# Patient Record
Sex: Female | Born: 1995 | Race: Black or African American | Hispanic: No | Marital: Single | Smoking: Never smoker
Health system: Southern US, Community
[De-identification: ages and names within clinical notes are randomized; demographics above are authoritative.]

## PROBLEM LIST (undated history)

## (undated) ENCOUNTER — Inpatient Hospital Stay: Payer: Self-pay

## (undated) DIAGNOSIS — J45909 Unspecified asthma, uncomplicated: Secondary | ICD-10-CM

---
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
## 2016-01-05 ENCOUNTER — Ambulatory Visit
Admission: RE | Admit: 2016-01-05 | Discharge: 2016-01-05 | Disposition: A | Discharge status: Home / Self Care | Payer: Medicaid Other | Source: Ambulatory Visit | Attending: Physician Assistant | Admitting: Physician Assistant

## 2016-01-05 DIAGNOSIS — Z3492 Encounter for supervision of normal pregnancy, unspecified, second trimester: Secondary | ICD-10-CM | POA: Insufficient documentation

## 2016-02-26 ENCOUNTER — Emergency Department
Admission: EM | Admit: 2016-02-26 | Discharge: 2016-02-26 | Disposition: A | Discharge status: Home / Self Care | Payer: Medicaid Other | Attending: Emergency Medicine | Admitting: Emergency Medicine

## 2016-02-26 ENCOUNTER — Emergency Department: Payer: Medicaid Other

## 2016-02-26 ENCOUNTER — Encounter: Payer: Self-pay | Admitting: Emergency Medicine

## 2016-02-26 DIAGNOSIS — O9A22 Injury, poisoning and certain other consequences of external causes complicating childbirth: Secondary | ICD-10-CM | POA: Diagnosis not present

## 2016-02-26 DIAGNOSIS — O9A212 Injury, poisoning and certain other consequences of external causes complicating pregnancy, second trimester: Secondary | ICD-10-CM | POA: Diagnosis present

## 2016-02-26 DIAGNOSIS — Z3A24 24 weeks gestation of pregnancy: Secondary | ICD-10-CM | POA: Insufficient documentation

## 2016-02-26 DIAGNOSIS — Y939 Activity, unspecified: Secondary | ICD-10-CM | POA: Diagnosis not present

## 2016-02-26 DIAGNOSIS — Y999 Unspecified external cause status: Secondary | ICD-10-CM | POA: Diagnosis not present

## 2016-02-26 DIAGNOSIS — Y92219 Unspecified school as the place of occurrence of the external cause: Secondary | ICD-10-CM | POA: Insufficient documentation

## 2016-02-26 DIAGNOSIS — X58XXXA Exposure to other specified factors, initial encounter: Secondary | ICD-10-CM | POA: Diagnosis not present

## 2016-02-26 DIAGNOSIS — J45909 Unspecified asthma, uncomplicated: Secondary | ICD-10-CM | POA: Diagnosis not present

## 2016-02-26 DIAGNOSIS — S86811A Strain of other muscle(s) and tendon(s) at lower leg level, right leg, initial encounter: Secondary | ICD-10-CM | POA: Insufficient documentation

## 2016-02-26 HISTORY — DX: Unspecified asthma, uncomplicated: J45.909

## 2016-02-26 NOTE — ED Notes (Signed)
Pt states right calf pain and swelling that began this am, denies injury to area. No visual swelling noted. Unable to assess warmth to area.

## 2016-02-26 NOTE — ED Provider Notes (Signed)
[facility] Emergency Department Provider Note        Time seen: ----------------------------------------- 4:56 PM on 02/26/2016 -----------------------------------------    I have reviewed the triage vital signs and the nursing notes.   HISTORY  Chief Complaint Leg Pain    HPI Catherine Schneider is a 20 y.o. female who presents to ER for right calf pain and swelling that began this morning. She denies any injuries, states she does go below stairs while she is in school. She is currently 6 months pregnant, denies vaginal bleeding or pregnancy related complaints. She states she still feels the baby move.   Past Medical History  Diagnosis Date  . Asthma     There are no active problems to display for this patient.   History reviewed. No pertinent past surgical history.  Allergies Review of patient's allergies indicates no known allergies.  Social History Social History  Substance Use Topics  . Smoking status: Never Smoker   . Smokeless tobacco: None  . Alcohol Use: No    Review of Systems Constitutional: Negative for fever. Cardiovascular: Negative for chest pain. Respiratory: Negative for shortness of breath. Gastrointestinal: Negative for abdominal pain, vomiting and diarrhea. Genitourinary: Negative for dysuria.Denies vaginal bleeding Musculoskeletal:Positive for right calf pain Skin: Negative for rash.  ____________________________________________   PHYSICAL EXAM:  VITAL SIGNS: ED Triage Vitals  Enc Vitals Group     BP 02/26/16 1340 94/60 mmHg     Pulse Rate 02/26/16 1340 99     Resp 02/26/16 1340 18     Temp 02/26/16 1340 98.1 F (36.7 C)     Temp Source 02/26/16 1340 Oral     SpO2 02/26/16 1340 100 %     Weight 02/26/16 1342 112 lb 14.4 oz (51.211 kg)     Height 02/26/16 1341  (1.651 m)     Head Cir --      Peak Flow --      Pain Score 02/26/16 1341 10     Pain Loc --      Pain Edu? --      Excl. in GC? --      Constitutional: Alert and oriented. Well appearing and in no distress. Eyes: Conjunctivae are normal. PERRL. Normal extraocular movements. Cardiovascular: Normal rate, regular rhythm. No murmurs, rubs, or gallops. Respiratory: Normal respiratory effort without tachypnea nor retractions. Breath sounds are clear and equal bilaterally. No wheezes/rales/rhonchi. Gastrointestinal: Gravid uterus, nontender Musculoskeletal: Minimal right calf tenderness and pain with range of motion. Neurologic:  Normal speech and language. No gross focal neurologic deficits are appreciated.  Skin:  Skin is warm, dry and intact. No rash noted. Psychiatric: Mood and affect are normal. Speech and behavior are normal.  ____________________________________________  ED COURSE:  Pertinent labs & imaging results that were available during my care of the patient were reviewed by me and considered in my medical decision making (see chart for details). Patient's no acute distress, will obtain ultrasound reevaluate. ____________________________________________   RADIOLOGY  IMPRESSION: No evidence of DVT within right lower extremity.  ____________________________________________  FINAL ASSESSMENT AND PLAN  Right calf strain  Plan: Patient with imaging as dictated above. Patient received an ultrasound to assess for DVT as she is pregnant. Fetal heart tones here were within normal limits. She is stable for discharge. Advised Tylenol as needed for pain.   Emily Filbert, MD   Note: This dictation was prepared with Dragon dictation. Any transcriptional errors that result from this process are unintentional  Emily FilbertJonathan E Williams, MD 02/26/16 [phone]

## 2016-02-26 NOTE — ED Notes (Signed)
Pt is 6 months pregnant, prenatal care at [facility].

## 2016-02-26 NOTE — Discharge Instructions (Signed)

## 2016-02-26 NOTE — ED Notes (Signed)
This RN assessed fetal heart tones, HR 150

## 2016-04-26 LAB — OB RESULTS CONSOLE GC/CHLAMYDIA
CHLAMYDIA, DNA PROBE: NEGATIVE
Gonorrhea: NEGATIVE

## 2016-04-26 LAB — OB RESULTS CONSOLE GBS: STREP GROUP B AG: POSITIVE

## 2016-05-10 LAB — OB RESULTS CONSOLE GBS: STREP GROUP B AG: POSITIVE

## 2016-05-17 ENCOUNTER — Observation Stay
Admission: EM | Admit: 2016-05-17 | Discharge: 2016-05-17 | Disposition: A | Discharge status: Home / Self Care | Payer: Medicaid Other | Source: Home / Self Care | Admitting: Obstetrics and Gynecology

## 2016-05-17 DIAGNOSIS — O9952 Diseases of the respiratory system complicating childbirth: Secondary | ICD-10-CM | POA: Diagnosis present

## 2016-05-17 DIAGNOSIS — O99824 Streptococcus B carrier state complicating childbirth: Secondary | ICD-10-CM | POA: Diagnosis present

## 2016-05-17 DIAGNOSIS — O471 False labor at or after 37 completed weeks of gestation: Secondary | ICD-10-CM

## 2016-05-17 DIAGNOSIS — Z3A39 39 weeks gestation of pregnancy: Secondary | ICD-10-CM | POA: Insufficient documentation

## 2016-05-17 DIAGNOSIS — J45909 Unspecified asthma, uncomplicated: Secondary | ICD-10-CM | POA: Diagnosis present

## 2016-05-17 MED ORDER — HYDROCODONE-ACETAMINOPHEN 5-325 MG PO TABS
2.0000 | ORAL_TABLET | Freq: Once | ORAL | Status: AC
  Administered 2016-05-17: 2 via ORAL
  Filled 2016-05-17: qty 2

## 2016-05-17 NOTE — OB Triage Note (Signed)
Pt. reports contractions beginning this afternoon and intensifying since then. Denies LOF (except discharge), VB; endorses good fetal movement.

## 2016-05-17 NOTE — OB Triage Note (Signed)
Pt presents to l/d with c/o contraction pains that started yesterday.

## 2016-05-18 ENCOUNTER — Inpatient Hospital Stay
Admission: EM | Admit: 2016-05-18 | Discharge: 2016-05-20 | DRG: 775 | Disposition: A | Discharge status: Home / Self Care | Payer: Medicaid Other | Attending: Obstetrics and Gynecology | Admitting: Obstetrics and Gynecology

## 2016-05-18 DIAGNOSIS — IMO0001 Reserved for inherently not codable concepts without codable children: Secondary | ICD-10-CM

## 2016-05-18 DIAGNOSIS — O9952 Diseases of the respiratory system complicating childbirth: Secondary | ICD-10-CM | POA: Diagnosis present

## 2016-05-18 DIAGNOSIS — O99824 Streptococcus B carrier state complicating childbirth: Secondary | ICD-10-CM | POA: Diagnosis present

## 2016-05-18 DIAGNOSIS — Z3A39 39 weeks gestation of pregnancy: Secondary | ICD-10-CM | POA: Diagnosis not present

## 2016-05-18 DIAGNOSIS — J45909 Unspecified asthma, uncomplicated: Secondary | ICD-10-CM | POA: Diagnosis present

## 2016-05-18 LAB — RAPID HIV SCREEN (HIV 1/2 AB+AG)
HIV 1/2 Antibodies: NONREACTIVE
HIV-1 P24 Antigen - HIV24: NONREACTIVE

## 2016-05-18 LAB — CBC
HEMATOCRIT: 35.2 % (ref 35.0–47.0)
HEMOGLOBIN: 11.5 g/dL — AB (ref 12.0–16.0)
MCH: 22.3 pg — AB (ref 26.0–34.0)
MCHC: 32.5 g/dL (ref 32.0–36.0)
MCV: 68.4 fL — AB (ref 80.0–100.0)
Platelets: 251 10*3/uL (ref 150–440)
RBC: 5.15 MIL/uL (ref 3.80–5.20)
RDW: 15.2 % — ABNORMAL HIGH (ref 11.5–14.5)
WBC: 20.6 10*3/uL — ABNORMAL HIGH (ref 3.6–11.0)

## 2016-05-18 LAB — TYPE AND SCREEN
ABO/RH(D): O POS
Antibody Screen: NEGATIVE

## 2016-05-18 LAB — CHLAMYDIA/NGC RT PCR (ARMC ONLY)
Chlamydia Tr: NOT DETECTED
N GONORRHOEAE: NOT DETECTED

## 2016-05-18 MED ORDER — SENNOSIDES-DOCUSATE SODIUM 8.6-50 MG PO TABS: 2.0000 | ORAL_TABLET | ORAL | Status: DC

## 2016-05-18 MED ORDER — OXYCODONE-ACETAMINOPHEN 5-325 MG PO TABS: 1.0000 | ORAL_TABLET | ORAL | Status: DC | PRN

## 2016-05-18 MED ORDER — SODIUM CHLORIDE 0.9 % IV SOLN
INTRAVENOUS | Status: AC
  Administered 2016-05-18: 2 g via INTRAVENOUS
  Filled 2016-05-18: qty 2000

## 2016-05-18 MED ORDER — AMPICILLIN SODIUM 1 G IJ SOLR
1.0000 g | INTRAMUSCULAR | Status: DC
  Administered 2016-05-18: 1 g via INTRAVENOUS
  Filled 2016-05-18 (×6): qty 1000

## 2016-05-18 MED ORDER — OXYCODONE-ACETAMINOPHEN 5-325 MG PO TABS: 2.0000 | ORAL_TABLET | ORAL | Status: DC | PRN

## 2016-05-18 MED ORDER — FERROUS SULFATE 325 (65 FE) MG PO TABS
325.0000 mg | ORAL_TABLET | Freq: Two times a day (BID) | ORAL | Status: DC
  Administered 2016-05-18 – 2016-05-20 (×4): 325 mg via ORAL
  Filled 2016-05-18 (×4): qty 1

## 2016-05-18 MED ORDER — SIMETHICONE 80 MG PO CHEW: 80.0000 mg | CHEWABLE_TABLET | ORAL | Status: DC | PRN

## 2016-05-18 MED ORDER — PRENATAL MULTIVITAMIN CH
1.0000 | ORAL_TABLET | Freq: Every day | ORAL | Status: DC
  Administered 2016-05-19 – 2016-05-20 (×2): 1 via ORAL
  Filled 2016-05-18 (×2): qty 1

## 2016-05-18 MED ORDER — DIPHENHYDRAMINE HCL 25 MG PO CAPS: 25.0000 mg | ORAL_CAPSULE | Freq: Four times a day (QID) | ORAL | Status: DC | PRN

## 2016-05-18 MED ORDER — BENZOCAINE-MENTHOL 20-0.5 % EX AERO: 1.0000 "application " | INHALATION_SPRAY | CUTANEOUS | Status: DC | PRN

## 2016-05-18 MED ORDER — SOD CITRATE-CITRIC ACID 500-334 MG/5ML PO SOLN: 30.0000 mL | ORAL | Status: DC | PRN

## 2016-05-18 MED ORDER — FENTANYL CITRATE (PF) 100 MCG/2ML IJ SOLN
INTRAMUSCULAR | Status: AC
  Administered 2016-05-18: 25 ug via INTRAVENOUS
  Filled 2016-05-18: qty 2

## 2016-05-18 MED ORDER — COCONUT OIL OIL: 1.0000 "application " | TOPICAL_OIL | Status: DC | PRN

## 2016-05-18 MED ORDER — LACTATED RINGERS IV SOLN: INTRAVENOUS | Status: DC

## 2016-05-18 MED ORDER — IBUPROFEN 600 MG PO TABS
600.0000 mg | ORAL_TABLET | Freq: Four times a day (QID) | ORAL | Status: DC
  Administered 2016-05-18 – 2016-05-20 (×5): 600 mg via ORAL
  Filled 2016-05-18 (×5): qty 1

## 2016-05-18 MED ORDER — SODIUM CHLORIDE 0.9 % IV SOLN
2.0000 g | Freq: Once | INTRAVENOUS | Status: AC
  Administered 2016-05-18: 2 g via INTRAVENOUS

## 2016-05-18 MED ORDER — LIDOCAINE HCL (PF) 1 % IJ SOLN
30.0000 mL | INTRAMUSCULAR | Status: DC | PRN
  Administered 2016-05-18: 30 mL via SUBCUTANEOUS

## 2016-05-18 MED ORDER — OXYTOCIN 40 UNITS IN LACTATED RINGERS INFUSION - SIMPLE MED
2.5000 [IU]/h | INTRAVENOUS | Status: DC
  Administered 2016-05-18: 2.5 [IU]/h via INTRAVENOUS

## 2016-05-18 MED ORDER — DIBUCAINE 1 % RE OINT: 1.0000 "application " | TOPICAL_OINTMENT | RECTAL | Status: DC | PRN

## 2016-05-18 MED ORDER — AMMONIA AROMATIC IN INHA
RESPIRATORY_TRACT | Status: AC
  Filled 2016-05-18: qty 10

## 2016-05-18 MED ORDER — WITCH HAZEL-GLYCERIN EX PADS: 1.0000 "application " | MEDICATED_PAD | CUTANEOUS | Status: DC | PRN

## 2016-05-18 MED ORDER — MISOPROSTOL 200 MCG PO TABS
ORAL_TABLET | ORAL | Status: AC
  Filled 2016-05-18: qty 4

## 2016-05-18 MED ORDER — LACTATED RINGERS IV SOLN
INTRAVENOUS | Status: DC
  Administered 2016-05-18 (×2): via INTRAVENOUS

## 2016-05-18 MED ORDER — ACETAMINOPHEN 325 MG PO TABS: 650.0000 mg | ORAL_TABLET | ORAL | Status: DC | PRN

## 2016-05-18 MED ORDER — BUTORPHANOL TARTRATE 1 MG/ML IJ SOLN
1.0000 mg | INTRAMUSCULAR | Status: DC | PRN
  Administered 2016-05-18: 1 mg via INTRAVENOUS
  Filled 2016-05-18: qty 1

## 2016-05-18 MED ORDER — LACTATED RINGERS IV SOLN: 500.0000 mL | INTRAVENOUS | Status: DC | PRN

## 2016-05-18 MED ORDER — OXYTOCIN 10 UNIT/ML IJ SOLN
INTRAMUSCULAR | Status: AC
  Filled 2016-05-18: qty 2

## 2016-05-18 MED ORDER — OXYTOCIN BOLUS FROM INFUSION
500.0000 mL | Freq: Once | INTRAVENOUS | Status: AC
  Administered 2016-05-18: 500 mL via INTRAVENOUS

## 2016-05-18 MED ORDER — ONDANSETRON HCL 4 MG PO TABS: 4.0000 mg | ORAL_TABLET | ORAL | Status: DC | PRN

## 2016-05-18 MED ORDER — FENTANYL CITRATE (PF) 100 MCG/2ML IJ SOLN
25.0000 ug | Freq: Once | INTRAMUSCULAR | Status: AC
  Administered 2016-05-18: 25 ug via INTRAVENOUS

## 2016-05-18 MED ORDER — ZOLPIDEM TARTRATE 5 MG PO TABS: 5.0000 mg | ORAL_TABLET | Freq: Every evening | ORAL | Status: DC | PRN

## 2016-05-18 MED ORDER — ONDANSETRON HCL 4 MG/2ML IJ SOLN: 4.0000 mg | INTRAMUSCULAR | Status: DC | PRN

## 2016-05-18 MED ORDER — LIDOCAINE HCL (PF) 1 % IJ SOLN
INTRAMUSCULAR | Status: AC
  Administered 2016-05-18: 30 mL via SUBCUTANEOUS
  Filled 2016-05-18: qty 30

## 2016-05-18 MED ORDER — OXYTOCIN 40 UNITS IN LACTATED RINGERS INFUSION - SIMPLE MED
INTRAVENOUS | Status: AC
  Administered 2016-05-18: 500 mL via INTRAVENOUS
  Filled 2016-05-18: qty 1000

## 2016-05-18 MED ORDER — ONDANSETRON HCL 4 MG/2ML IJ SOLN: 4.0000 mg | Freq: Four times a day (QID) | INTRAMUSCULAR | Status: DC | PRN

## 2016-05-18 NOTE — OB Triage Note (Signed)
Pt. presented to [facility] for a labor evaluation the evening of 7.25; she was discharged and given labor precautions. She reports new onset vaginal bleeding (" a lot, not spots") and continued contractions. Endorses good fetal movement.

## 2016-05-18 NOTE — H&P (Signed)
Anitha A Coriz is a 20 y.o. female presenting for active labor at 39+2 weeks . Cx 5 cm and BBOW .. + GBS OB History    Gravida Para Term Preterm AB Living   1             SAB TAB Ectopic Multiple Live Births                 Past Medical History:  Diagnosis Date  . Asthma   likely alpha thalessemia  History reviewed. No pertinent surgical history. Family History: family history is not on file. Social History:  reports that she has never smoked. She has never used smokeless tobacco. She reports that she does not drink alcohol or use drugs.     ROS    No labs from [facility]  History Dilation: 6 Effacement (%): 90 Station: 0 Exam by:: ess, RN Blood pressure 119/77, pulse 83, temperature 98.4 F (36.9 C), temperature source Oral, resp. rate 16, height 5\' 3"  (1.6 m), weight 59 kg (130 lb).  EFM : + reassuring, ctx q 4 min .  Exam Physical Exam  Lungs CTA  CV RRR cx exam as above   Prenatal labs: ABO, Rh:   Antibody:   Rubella:   RPR:    HBsAg:    HIV:    GBS: Positive (07/18 0000)  No other labs in chart  Assessment/Plan: Labor , + GBS  Start ampicillin  Reassuring fetal monitoring  Will call again for lab results    SCHERMERHORN,THOMAS 05/18/2016, 7:15 AM

## 2016-05-18 NOTE — Progress Notes (Signed)
I am assuming care of this patient with Dr. Dalbert Garnet as my backup physician   S:   Pt. Has recently received 1mg  Stadol IVP and is sleeping          O:  VS: Blood pressure 111/68, pulse 81, temperature 97.6 F (36.4 C), temperature source Oral, resp. rate 18, height 5\' 3"  (1.6 m), weight 59 kg (130 lb).        FHR : baseline 135 bpm / variability minimal to moderate - just received Stadol  / accelerations + / occasional late decelerations        Toco: contractions every 5-6 minutes / Moderate        Cervix : Dilation: 7 Effacement (%): 80 Station: 0 Presentation: Vertex Exam by:: JRT JAC  Positive scalp stimulation         Membranes: intact  A:  Active labor     FHR category 2     GBS Positive   P: Will plan for AROM after adequate time after abx      IVF Bolus 500mg  x1       Apply oxygen by facemask       Anticipate NSVD  Carlean Jews, CNM

## 2016-05-18 NOTE — Progress Notes (Signed)
S:  Pt. Feeling more pressure and involuntarily pushing       Sleeping/lethargic in between ctxs  O:  VS: Blood pressure 108/67, pulse 80, temperature 97.6 F (36.4 C), temperature source Oral, resp. rate 18, height 5\' 3"  (1.6 m), weight 59 kg (130 lb).        FHR : baseline 135bpm / variability minimal to moderate / accelerations + /  decelerations / positive scalp stimulation         Toco: contractions every 3-5 minutes / Moderate-strong        Cervix : Dilation: 9 Effacement (%): 100 Station: 0 Presentation: Vertex Exam by:: Sharyl Nimrod, CNM        Membranes: SROM - clear/white fluid   A: Active labor     FHR category 2     GBS positive   P: Caput at +1 with narrow pubic arch       Has received 1 dose of Ampicillin at 0718      Anticipate NSVD  Carlean Jews, CNM

## 2016-05-19 LAB — CBC
HCT: 30 % — ABNORMAL LOW (ref 35.0–47.0)
Hemoglobin: 9.7 g/dL — ABNORMAL LOW (ref 12.0–16.0)
MCH: 22.2 pg — ABNORMAL LOW (ref 26.0–34.0)
MCHC: 32.3 g/dL (ref 32.0–36.0)
MCV: 68.8 fL — AB (ref 80.0–100.0)
PLATELETS: 236 10*3/uL (ref 150–440)
RBC: 4.36 MIL/uL (ref 3.80–5.20)
RDW: 15.2 % — AB (ref 11.5–14.5)
WBC: 22.5 10*3/uL — AB (ref 3.6–11.0)

## 2016-05-19 LAB — RPR: RPR: NONREACTIVE

## 2016-05-19 NOTE — Progress Notes (Signed)
Post Partum Day 1 Subjective: Feeling well, no complaints  Objective: Blood pressure 115/64, pulse 94, temperature 98 F (36.7 C), temperature source Oral, resp. rate 18, height 5\' 3"  (1.6 m), weight 130 lb (59 kg), SpO2 97 %.  Physical Exam:  General: A,A, & O x 3 Lochia: mod, no clots Uterine Fundus:Firm, U-2 Perineum intact DVT Evaluation:Neg Homans   Recent Labs  05/18/16 0646 05/19/16 0522  HGB 11.5* 9.7*  HCT 35.2 30.0*  WBC 20.6* 22.5*  PLT 251 236    Assessment/Plan: A: PPD#1 stable 2. 3rd degree lac P: DC in am   LOS: 1 day   Sharee Pimple 05/19/2016, 9:09 AM

## 2016-05-19 NOTE — Clinical Social Work Note (Signed)
CSW received consult from night nurse stating patient is cognitively impaired. CSW met with patient's day nurse and she stated that patient has not behaved as though she has cognitive issues today and that she has cared for her newborn appropriately. CSW went in to speak with patient and patient was sleeping soundly and did not awaken. Patient's newborn was in the nursery. CSW will attempt to see patient again. Shela Leff MSW,LCSW [phone]

## 2016-05-20 MED ORDER — IBUPROFEN 600 MG PO TABS: 600.0000 mg | ORAL_TABLET | Freq: Four times a day (QID) | ORAL | 0 refills | Status: DC

## 2016-05-20 NOTE — Discharge Summary (Signed)
Obstetric Discharge Summary Reason for Admission: onset of labor Prenatal Procedures: none Intrapartum Procedures: spontaneous vaginal delivery and 3rd deg (stage 1) laceration Postpartum Procedures: none Complications-Operative and Postpartum: 3 degree perineal laceration Hemoglobin  Date Value Ref Range Status  05/19/2016 9.7 (L) 12.0 - 16.0 g/dL Final   HCT  Date Value Ref Range Status  05/19/2016 30.0 (L) 35.0 - 47.0 % Final    Physical Exam:  General: alert, cooperative and appears stated age 20: appropriate Uterine Fundus: firm Incision: healing well, no significant drainage, no dehiscence DVT Evaluation: No evidence of DVT seen on physical exam.  Discharge Diagnoses: Term Pregnancy-delivered and some cognitive impairment- is following with social worker in the office, and patient's mother will help her care for the infant  Discharge Information: Date: 05/20/2016 Activity: pelvic rest Diet: routine Medications: Ibuprofen Condition: stable Instructions: refer to practice specific booklet Discharge to: home  F/u in 6 weeks with [facility]- nexplanon planned Newborn Data: Live born female "Isaish" Birth Weight: 6 lb 11.2 oz (3040 g) APGAR: 8, 9  Home with mother.  Christeen Douglas 05/20/2016, 1:44 PM

## 2016-05-20 NOTE — Progress Notes (Signed)
Discharge instructions provided to patient and patients mother.  Both verbalized understanding. Questions answered.  Follow up appointment reviewed, Rx given. Patient discharged to home with infant via wheelchair.

## 2016-05-20 NOTE — Discharge Instructions (Signed)
Care After Vaginal Delivery °Congratulations on your new baby!! ° °Refer to this sheet in the next few weeks. These discharge instructions provide you with information on caring for yourself after delivery. Your caregiver may also give you specific instructions. Your treatment has been planned according to the most current medical practices available, but problems sometimes occur. Call your caregiver if you have any problems or questions after you go home. ° °HOME CARE INSTRUCTIONS °· Take over-the-counter or prescription medicines only as directed by your caregiver or pharmacist. °· Do not drink alcohol, especially if you are breastfeeding or taking medicine to relieve pain. °· Do not chew or smoke tobacco. °· Do not use illegal drugs. °· Continue to use good perineal care. Good perineal care includes: °¨ Wiping your perineum from front to back. °¨ Keeping your perineum clean. °· Do not use tampons or douche until your caregiver says it is okay. °· Shower, wash your hair, and take tub baths as directed by your caregiver. °· Wear a well-fitting bra that provides breast support. °· Eat healthy foods. °· Drink enough fluids to keep your urine clear or pale yellow. °· Eat high-fiber foods such as whole grain cereals and breads, brown rice, beans, and fresh fruits and vegetables every day. These foods may help prevent or relieve constipation. °· Follow your caregiver's recommendations regarding resumption of activities such as climbing stairs, driving, lifting, exercising, or traveling. Specifically, no driving for two weeks, so that you are comfortable reacting quickly in an emergency. °· Talk to your caregiver about resuming sexual activities. Resumption of sexual activities is dependent upon your risk of infection, your rate of healing, and your comfort and desire to resume sexual activity. Usually we recommend waiting about six weeks, or until your bleeding stops and you are interested in sex. °· Try to have someone  help you with your household activities and your newborn for at least a few days after you leave the hospital. Even longer is better. °· Rest as much as possible. Try to rest or take a nap when your newborn is sleeping. Sleep deprivation can be very hard after delivery. °· Increase your activities gradually. °· Keep all of your scheduled postpartum appointments. It is very important to keep your scheduled follow-up appointments. At these appointments, your caregiver will be checking to make sure that you are healing physically and emotionally. ° °SEEK MEDICAL CARE IF:  °· You are passing large clots from your vagina.  °· You have a foul smelling discharge from your vagina. °· You have trouble urinating. °· You are urinating frequently. °· You have pain when you urinate. °· You have a change in your bowel movements. °· You have increasing redness, pain, or swelling near your vaginal incision (episiotomy) or vaginal tear. °· You have pus draining from your episiotomy or vaginal tear. °· Your episiotomy or vaginal tear is separating. °· You have painful, hard, or reddened breasts. °· You have a severe headache. °· You have blurred vision or see spots. °· You feel sad or depressed. °· You have thoughts of hurting yourself or your newborn. °· You have questions about your care, the care of your newborn, or medicines. °· You are dizzy or light-headed. °· You have a rash. °· You have nausea or vomiting. °· You were breastfeeding and have not had a menstrual period within 12 weeks after you stopped breastfeeding. °· You are not breastfeeding and have not had a menstrual period by the 12th week after delivery. °· You   have a fever. ° °SEEK IMMEDIATE MEDICAL CARE IF:  °· You have persistent pain. °· You have chest pain. °· You have shortness of breath. °· You faint. °· You have leg pain. °· You have stomach pain. °· Your vaginal bleeding saturates two or more sanitary pads in 1 hour. ° °MAKE SURE YOU:  °· Understand these  instructions. °· Will get help right away if you are not doing well or get worse. °·  °Document Released: 10/07/2000 Document Revised: 02/24/2014 Document Reviewed: 06/06/2012 ° °ExitCare® Patient Information ©2015 [employer]. This information is not intended to replace advice given to you by your health care provider. Make sure you discuss any questions you have with your health care provider. ° °

## 2016-05-20 NOTE — Clinical Social Work Maternal (Signed)
  CLINICAL SOCIAL WORK MATERNAL/CHILD NOTE  Patient Details  Name: Catherine Schneider MRN: [id number] Date of Birth: Jan 18, 1996  Date:  05/20/2016  Clinical Social Worker Initiating Note:  Shela Leff MSW,LCSW Date/ Time Initiated:  05/20/16/      Child's Name:      Legal Guardian:  Mother   Need for Interpreter:  None   Date of Referral:  05/19/16     Reason for Referral:  Other (Comment) (concerns for cognitive delay)   Referral Source:  RN   Address:     Phone number:      Household Members:   (Mother)   Natural Supports (not living in the home):  Spouse/significant other   Professional Supports: None   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 Patient is childlike in behavior and has had some difficulty with keeping up with feedings. Patient's mother stated that patient and newborn would be living with her. Patient and her mother verified that they had all necessities for newborn and no concerns about transportation. 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 Patient is in special education classes at [employer]. Patient has good familial support.  CSW Plan/Description:  No Further Intervention Required/No Barriers to Discharge    Shela Leff, LCSW 05/20/2016, 1:55 PM

## 2016-05-23 NOTE — Discharge Summary (Signed)
  Pt G1P0 in latent labor . No change of cervical dilation. Pt is given 2 Norco tabs and d/c home with precautions

## 2017-06-07 IMAGING — US US OB COMP LESS 14 WK
1 series · 14 of 28 positions shown · non-contrast
Comparison: None.

CLINICAL DATA: 19-year-old pregnant female presenting with
abdominal cramping.

EXAM:
OBSTETRIC <14 WK ULTRASOUND
TECHNIQUE: Transabdominal ultrasound was performed for evaluation of the
gestation as well as the maternal uterus and adnexal regions.

[Series 1: us ob comp less 14 wk · 0.16mm/px · 14 of 46 slices shown]
[im 2/46]
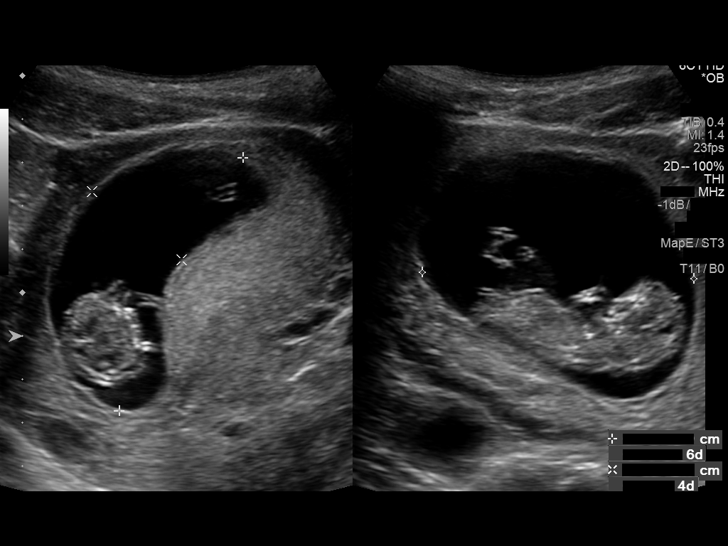
[im 6/46]
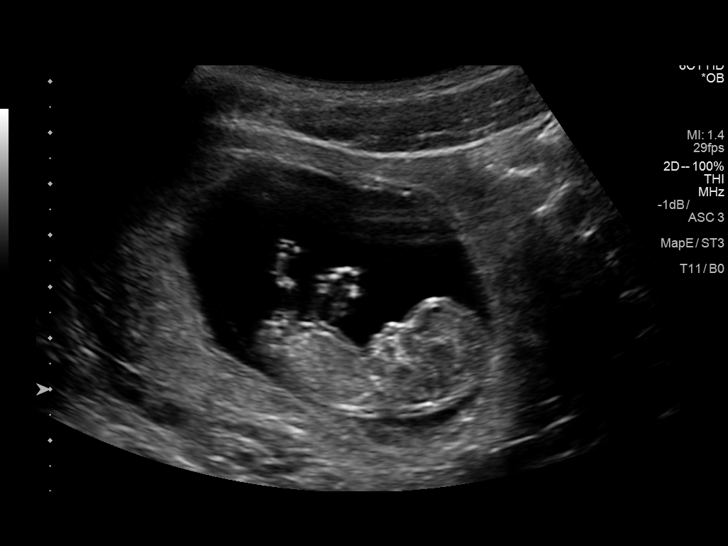
[im 9/46]
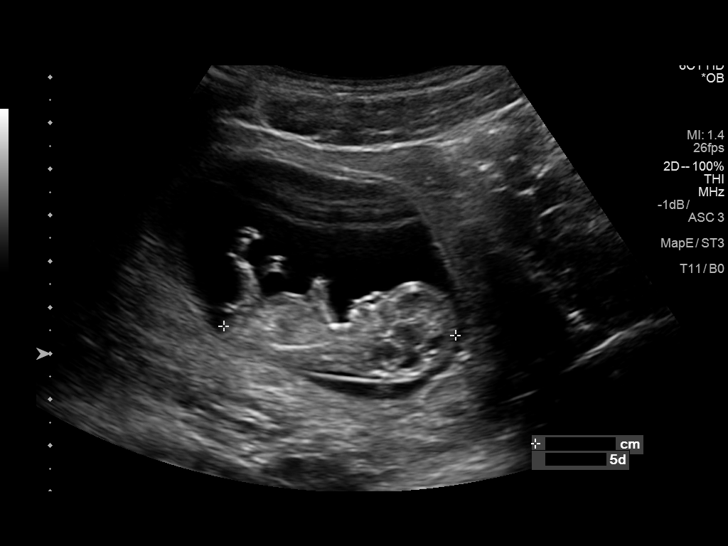
[im 12/46]
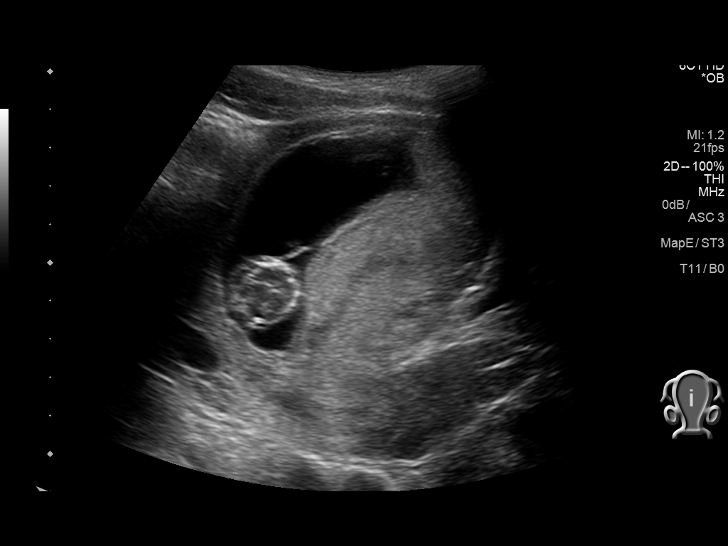
[im 16/46]
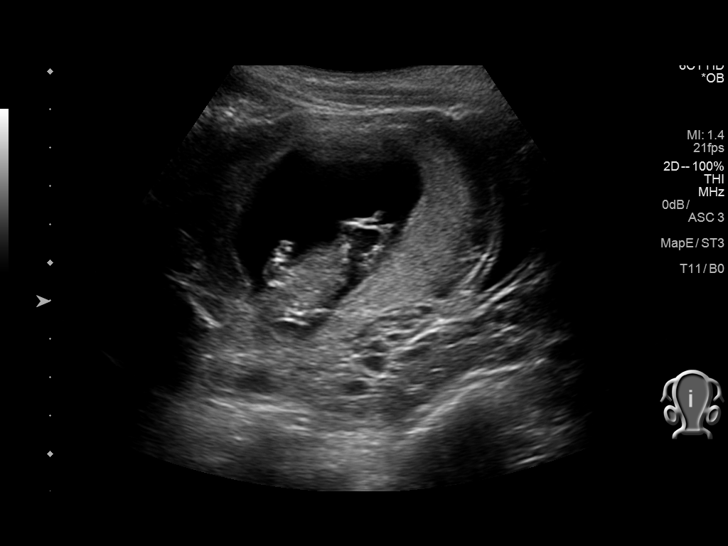
[im 19/46]
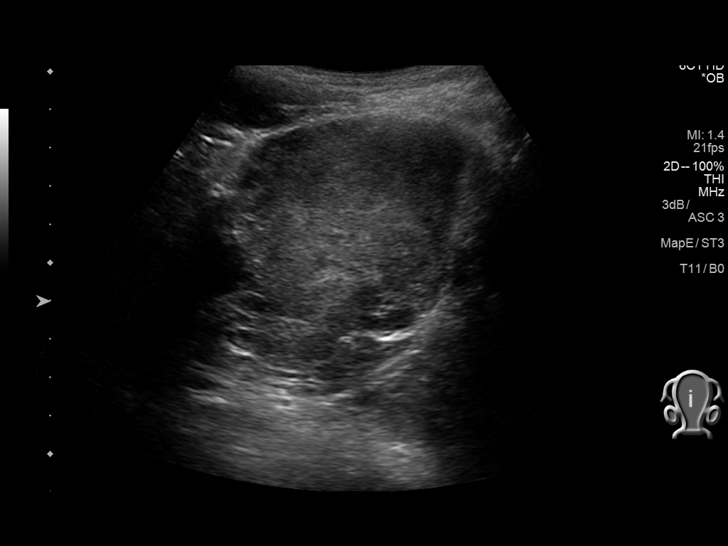
[im 22/46]
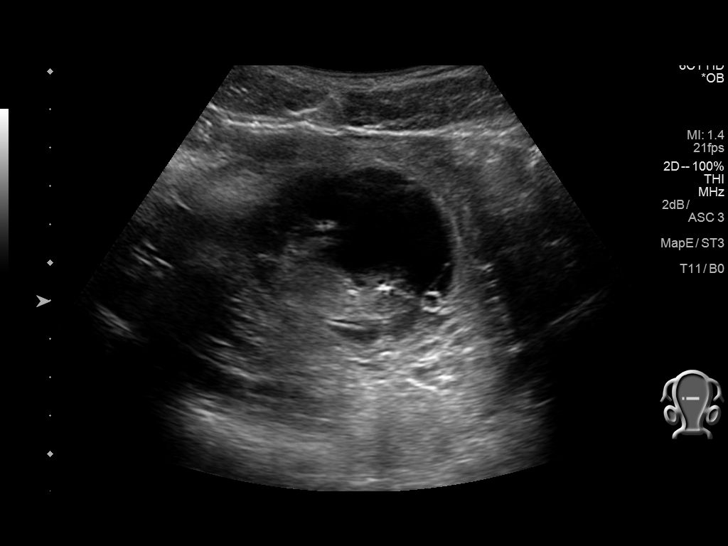
[im 26/46]
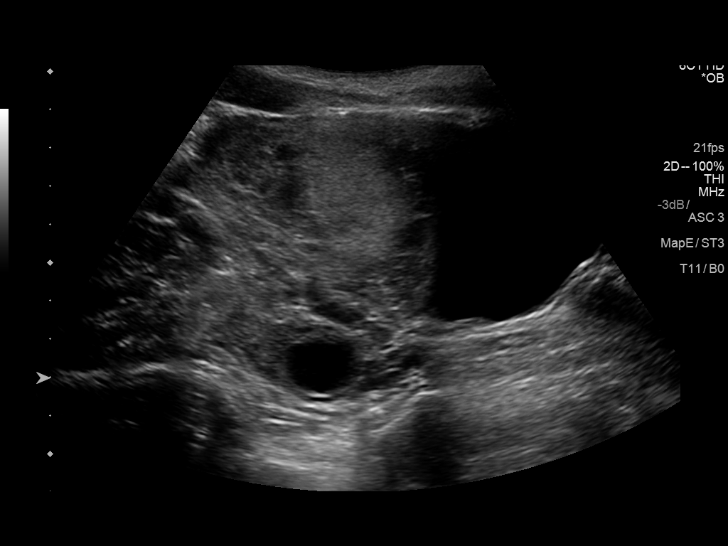
[im 29/46]
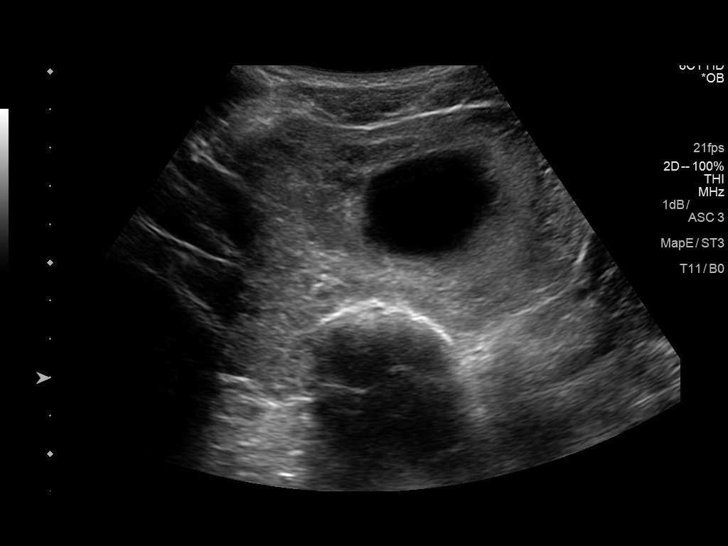
[im 32/46]
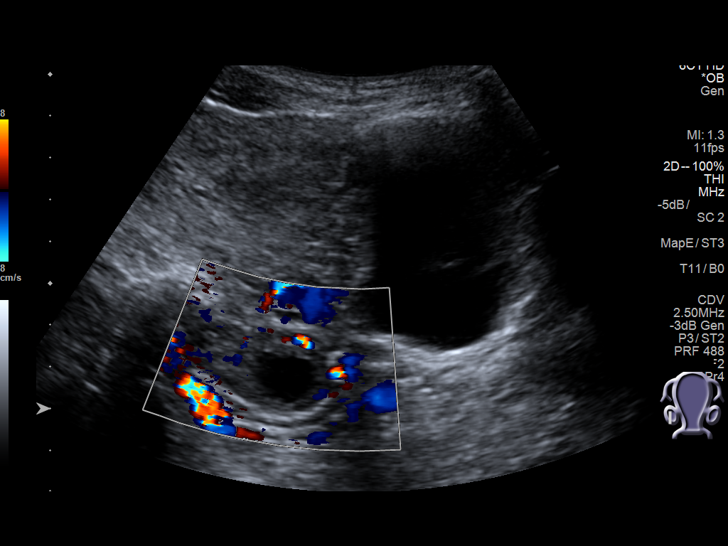
[im 36/46]
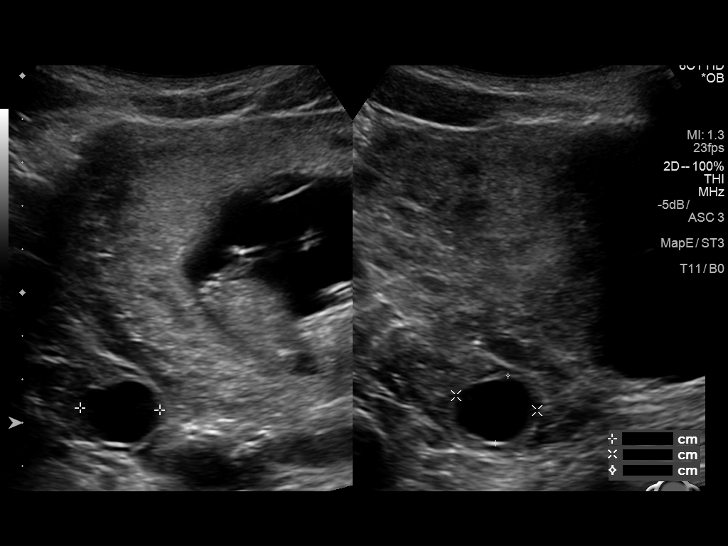
[im 39/46]
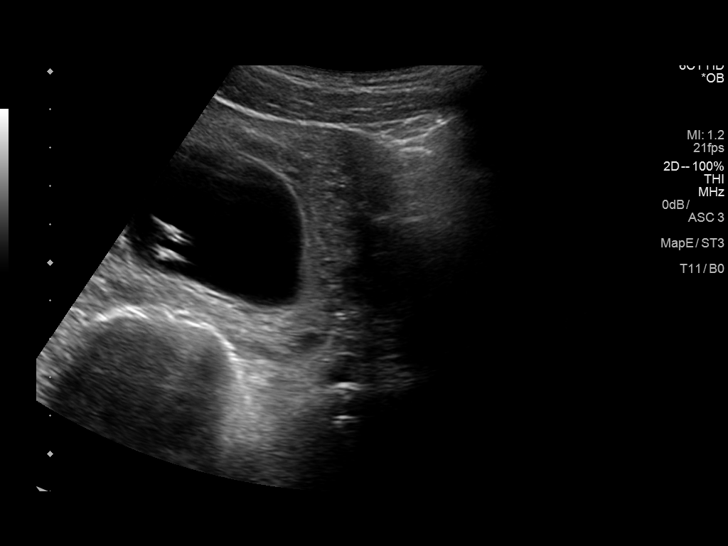
[im 42/46]
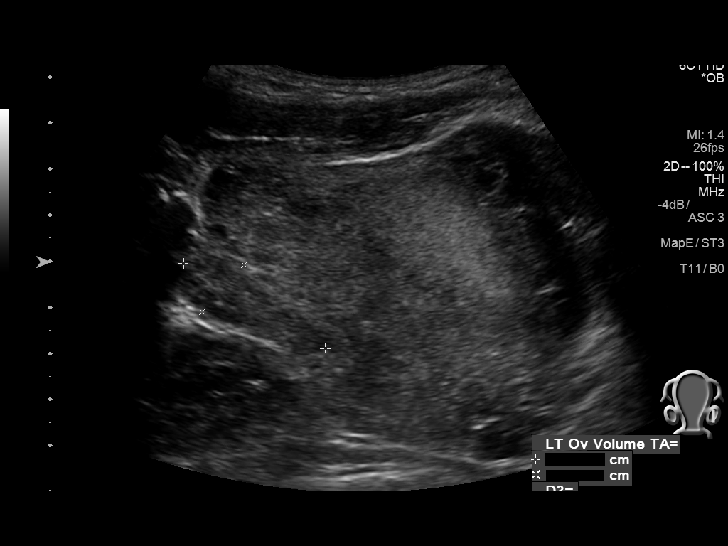
[im 46/46]
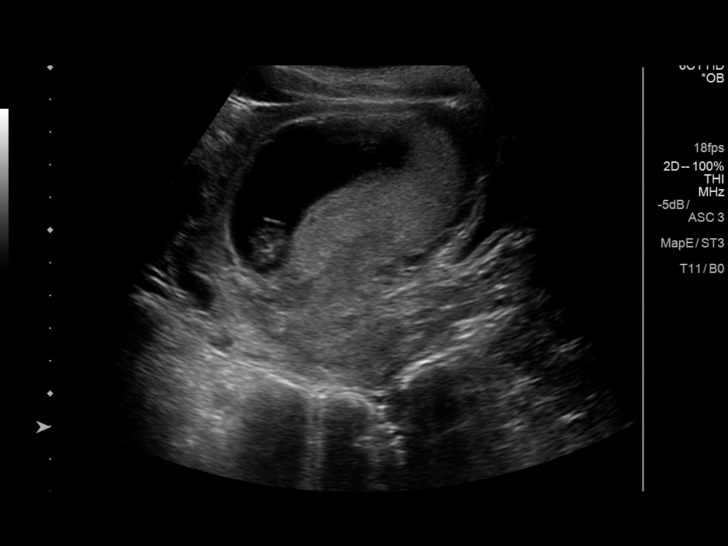

[14 of 28 positions shown; findings below may reference images not displayed]

FINDINGS: Intrauterine gestational sac: Single intrauterine gestational sac

Yolk sac:  Present

Embryo:  Seen

Cardiac Activity: Detected

Heart Rate: 157 bpm

CRL:   5  mm   11 w 4 d                  US EDC: 05/23/2016

Subchorionic hemorrhage:  None visualized.

Maternal uterus/adnexae: The maternal ovaries appear unremarkable.
The right ovary measures 3.9 x 2.0 x 2.4 cm. There is a 1.9 cm cyst
in the right ovary. The left ovary measures 3.6 x 1.4 x 1.4 cm.
IMPRESSION: Single live intrauterine pregnancy with an estimated gestational age
of 11 weeks, 4 days.

## 2017-08-06 IMAGING — US US OB COMP +14 WK
1 series · 13 of 28 positions shown · non-contrast
Comparison: none

CLINICAL DATA: Current assigned gestational age of 20 weeks 1 day.
Evaluate fetal anatomy and growth.

EXAM:
OBSTETRICAL ULTRASOUND >14 WKS

[Series 1: us ob comp +14 wk · 0.19mm/px · 13 of 93 slices shown]
[im 4/93]
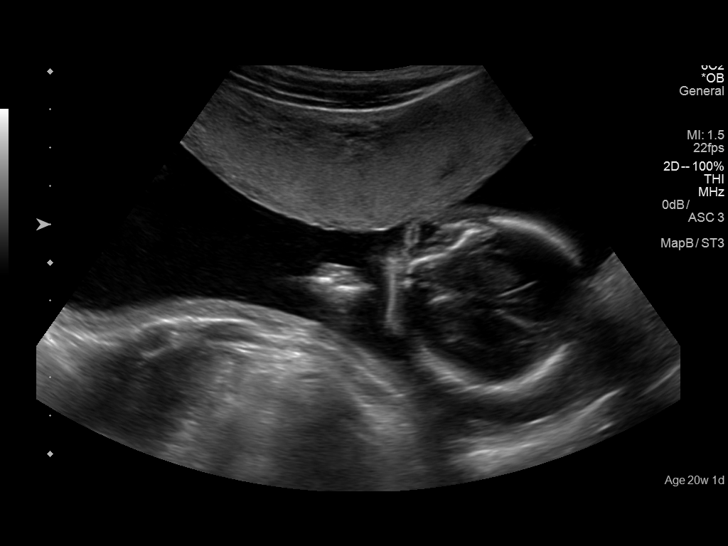
[im 11/93]
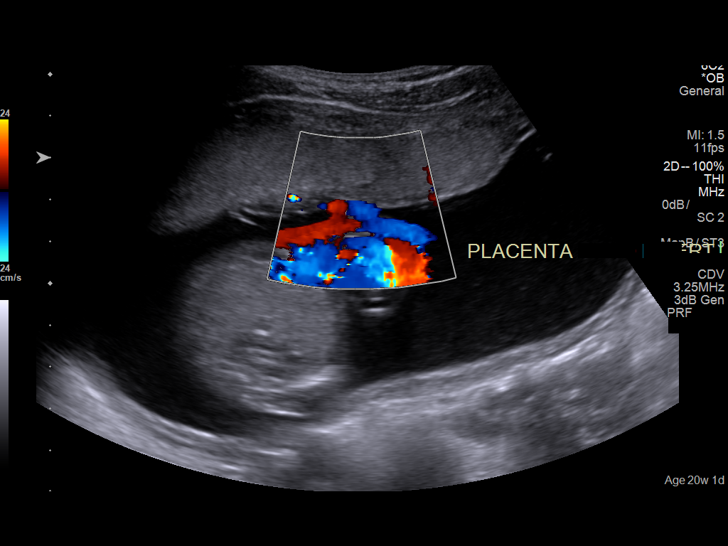
[im 18/93]
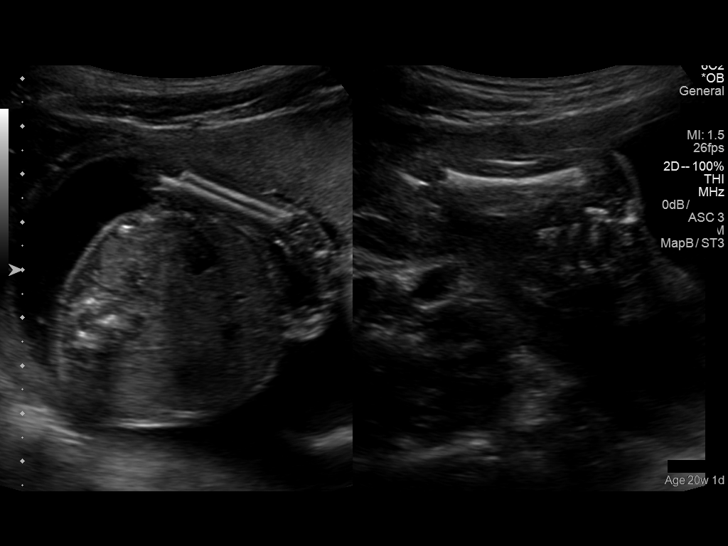
[im 24/93]
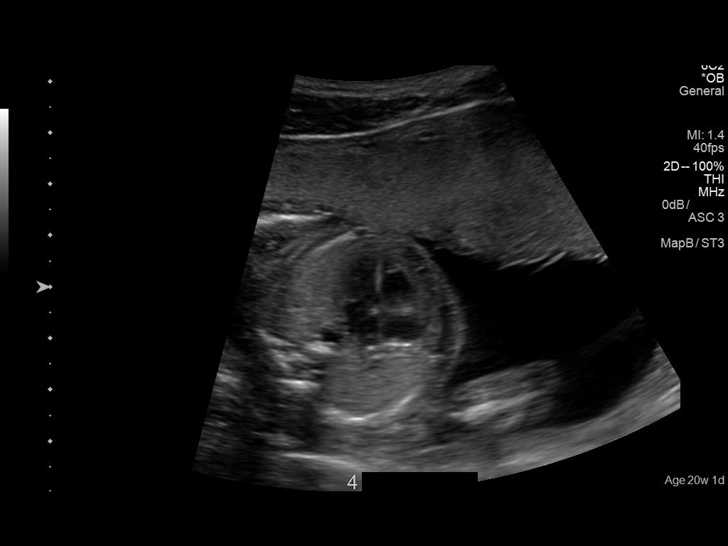
[im 31/93]
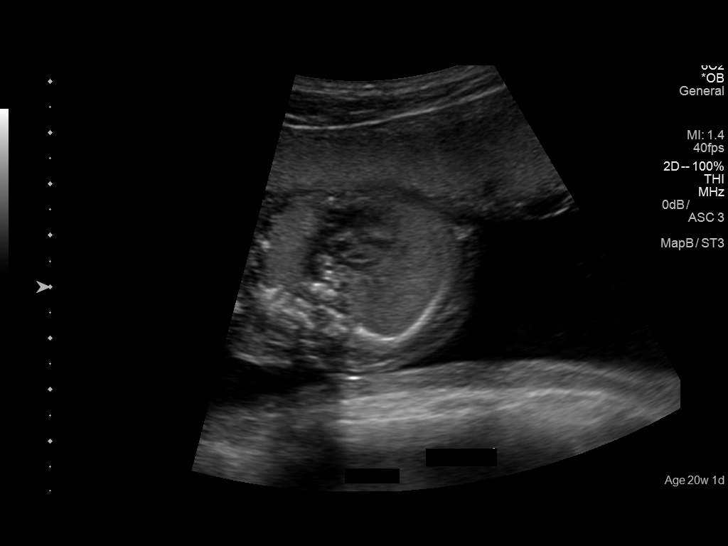
[im 38/93]
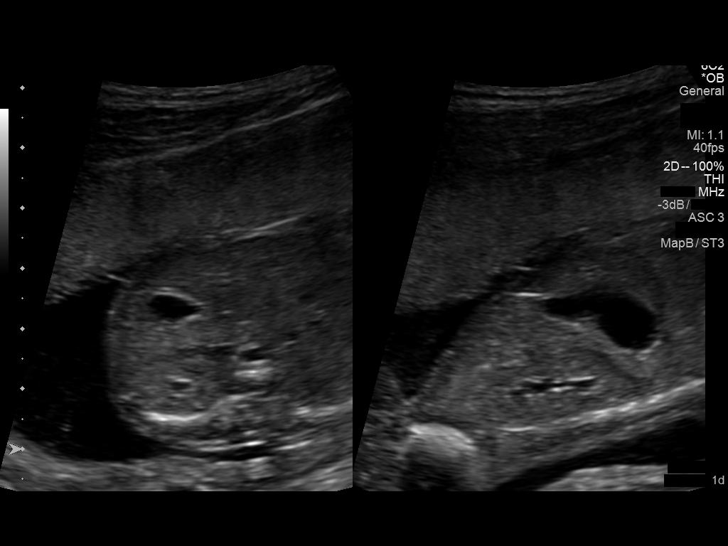
[im 48/93]
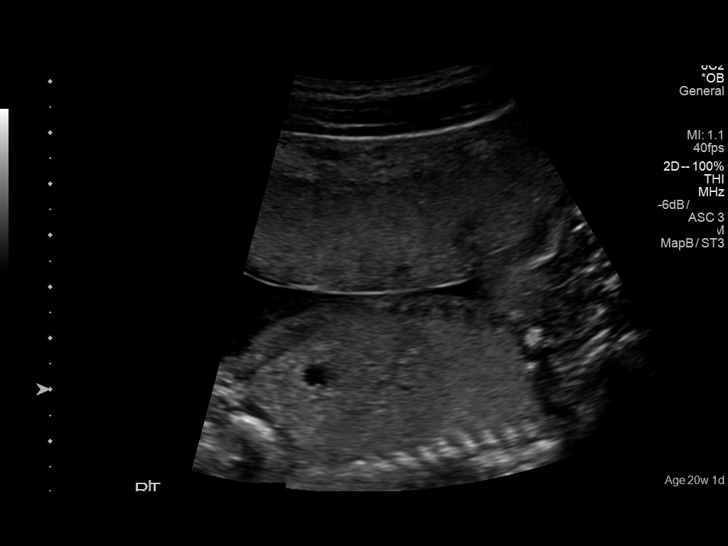
[im 55/93]
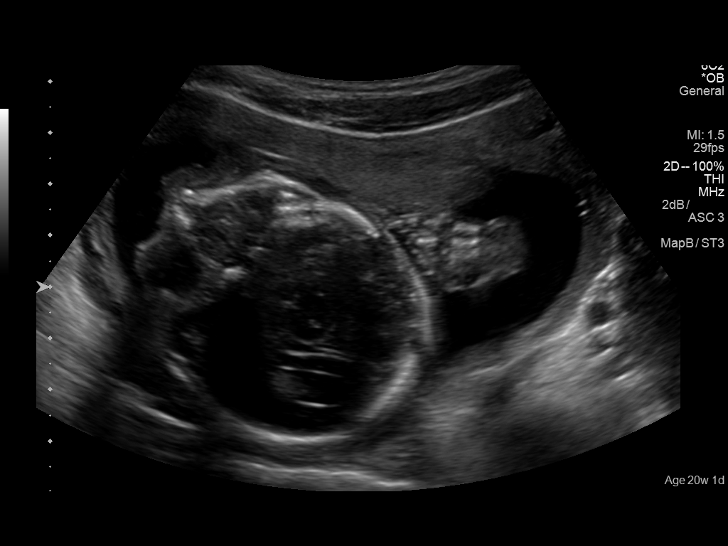
[im 62/93]
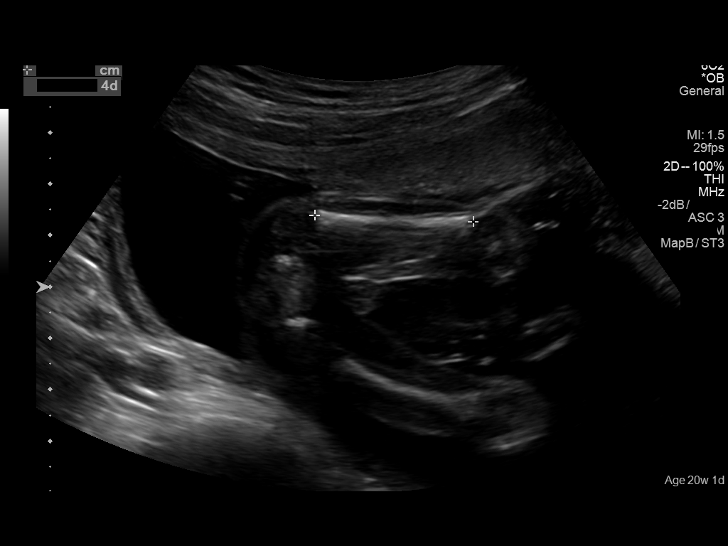
[im 69/93]
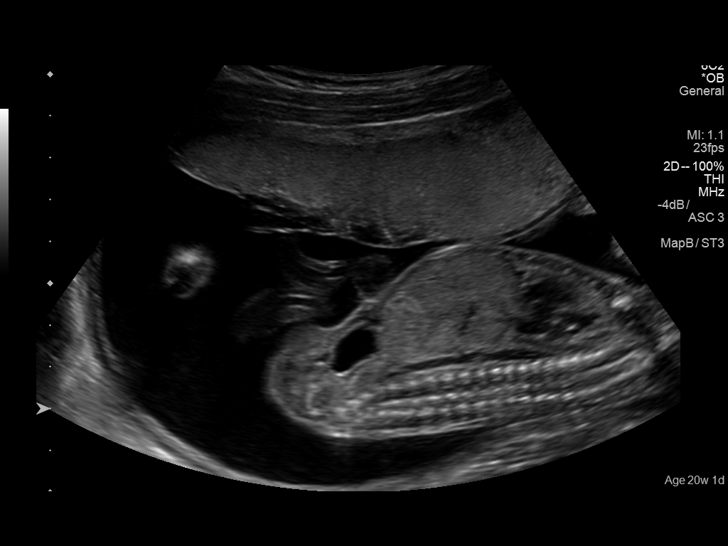
[im 75/93]
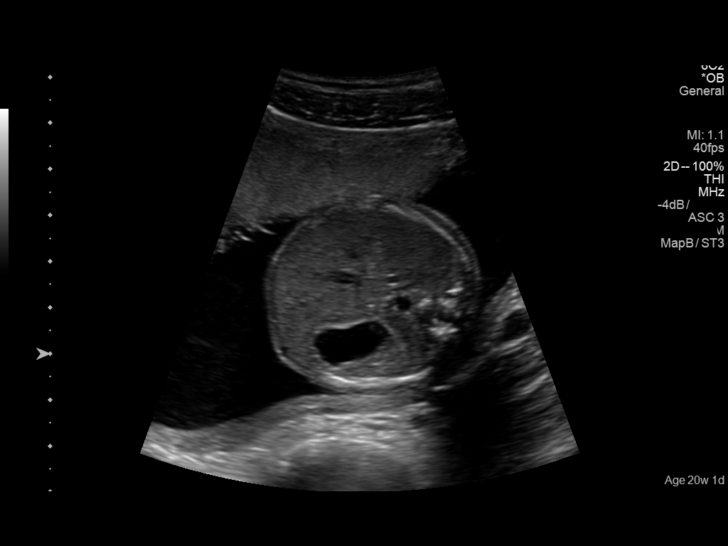
[im 82/93]
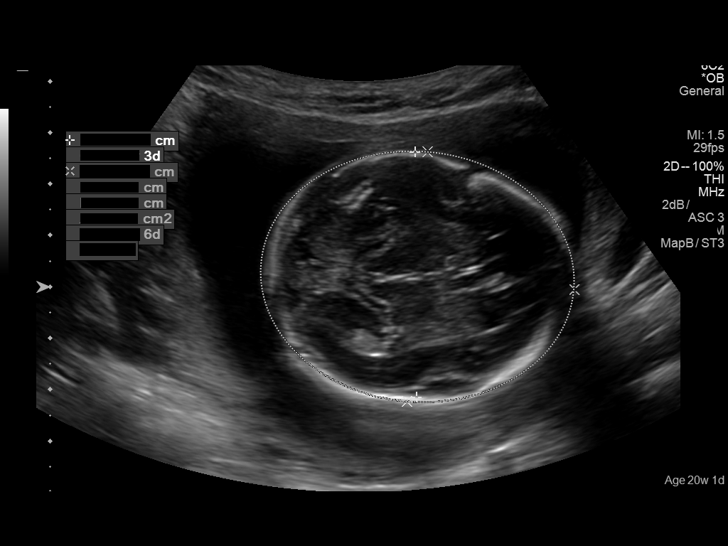
[im 89/93]
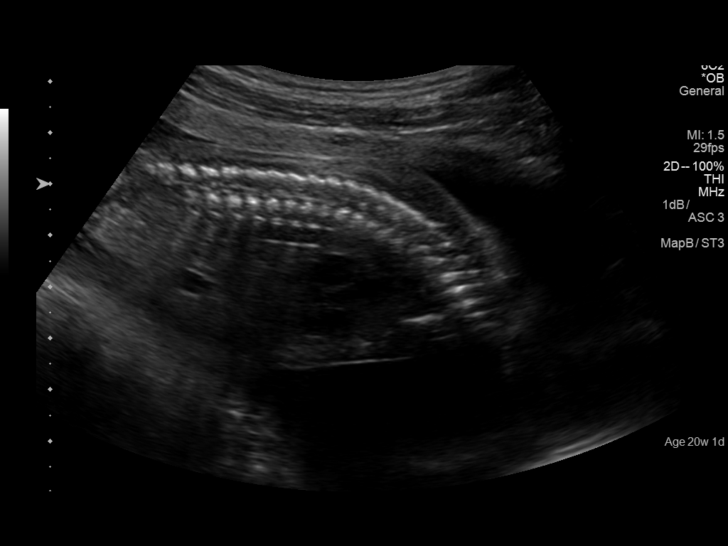

[13 of 28 positions shown; findings below may reference images not displayed]

FINDINGS: Number of Fetuses: 1

Heart Rate:  155 bpm

Movement: Yes

Presentation: Cephalic

Previa: No

Placental Location: Anterior

Amniotic Fluid (Subjective): Normal

Amniotic Fluid (Objective):

Vertical pocket 5.4cm

FETAL BIOMETRY

BPD:  4.8cm 20w 3d

HC:    17.32cm  19w   6d

AC:   14.04cm  19w   3d

FL:   3.06cm  19w   6d

Current Mean GA: 19w 6d              US EDC: 05/25/16

FETAL ANATOMY

Lateral Ventricles:  Appear normal

Thalami/CSP:  Appear normal

Posterior Fossa:  Appears normal

Nuchal Region:  Appears normal NFT= 2.8mm

Upper Lip:  Appears normal

Spine:  Appears normal

4 Chamber Heart on Left:  Appears normal

LVOT:  Appears normal

RVOT:  Appears normal

Stomach on Left:  Appears normal

3 Vessel Cord:  Appears normal

Cord Insertion site:  Appears normal

Kidneys:  Appear normal

Bladder:  Appears normal

Extremities:  Appear normal

Sex:  Visualized

Maternal Findings:

Cervix:  3.6 cm transabdominally
IMPRESSION: Assigned gestational age is currently 20 weeks 1 day. Appropriate
interval fetal growth.

No fetal anomalies seen involving visualized anatomy noted above.

## 2017-09-27 IMAGING — US US EXTREM LOW VENOUS*R*
1 series · 13 of 24 positions shown · non-contrast
Comparison: None.

CLINICAL DATA: Right lower extremity calf pain. Six-months
pregnant. Evaluate for DVT.



[Series 1: us extrem low venous*right* · 0.06mm/px · 13 of 34 slices shown]
[im 1/34]
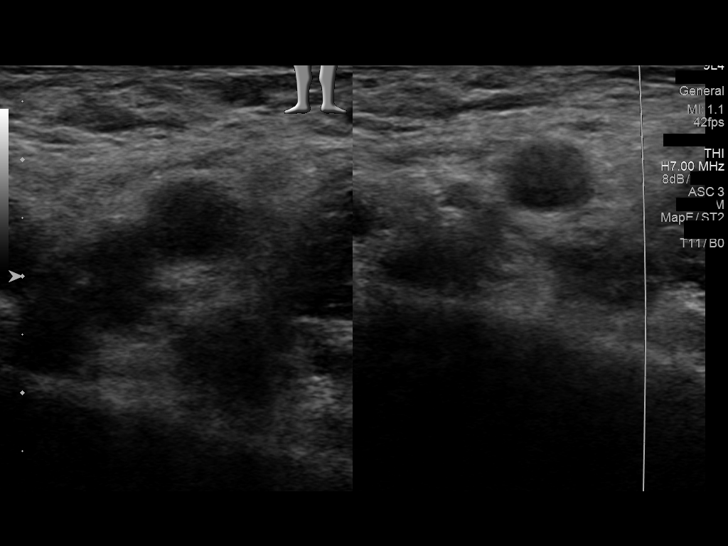
[im 3/34]
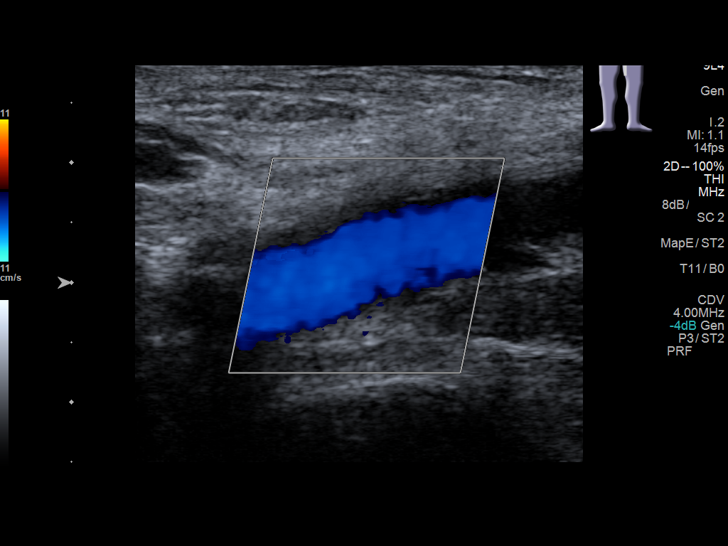
[im 6/34]
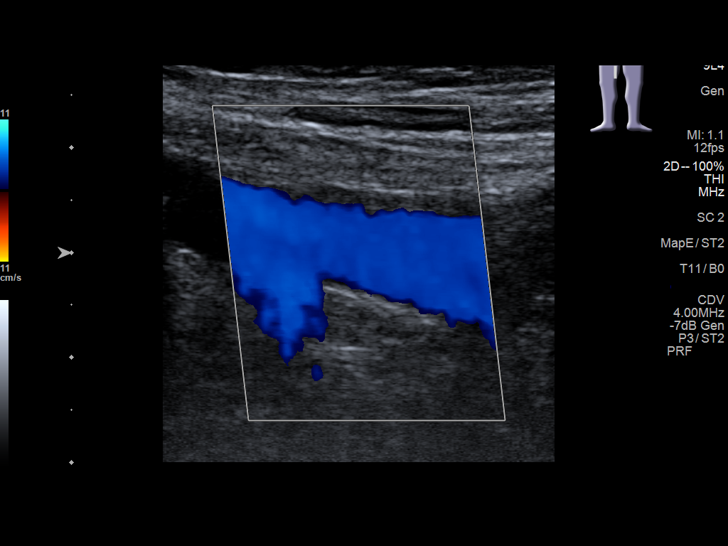
[im 9/34]
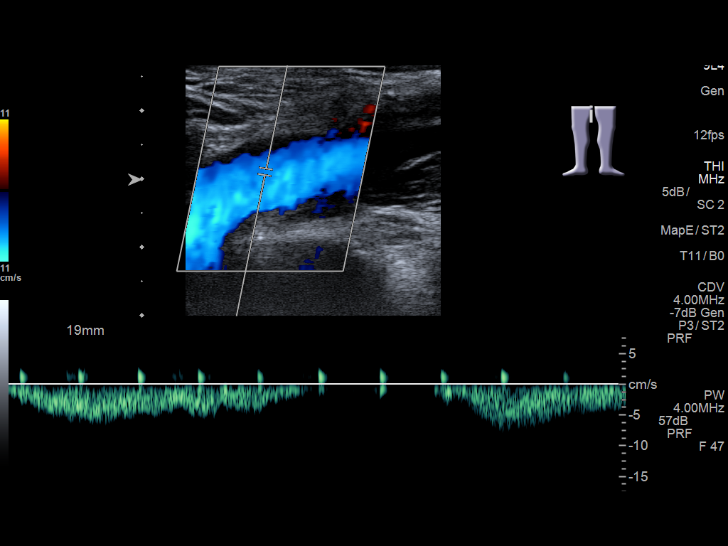
[im 12/34]
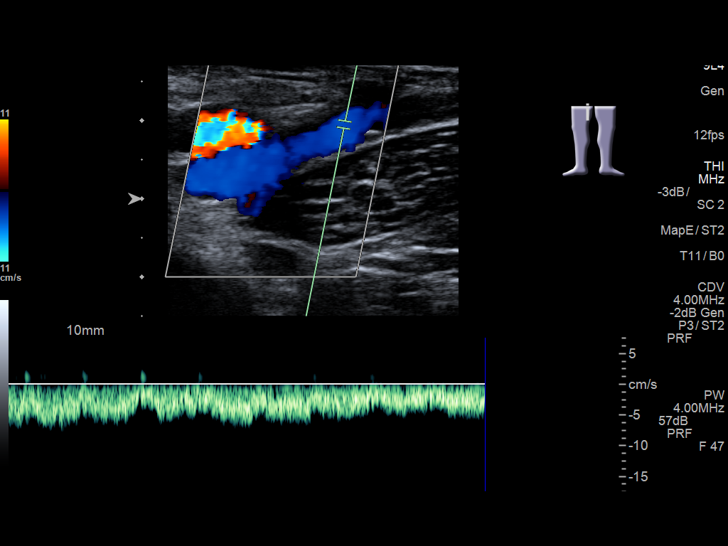
[im 15/34]
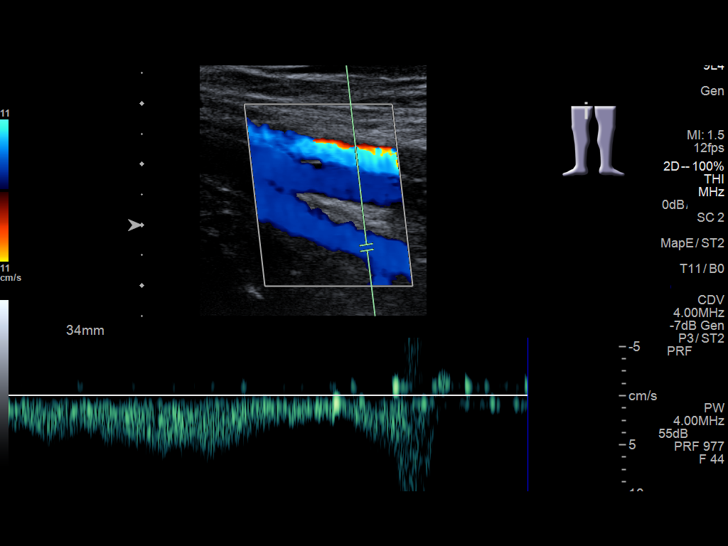
[im 18/34]
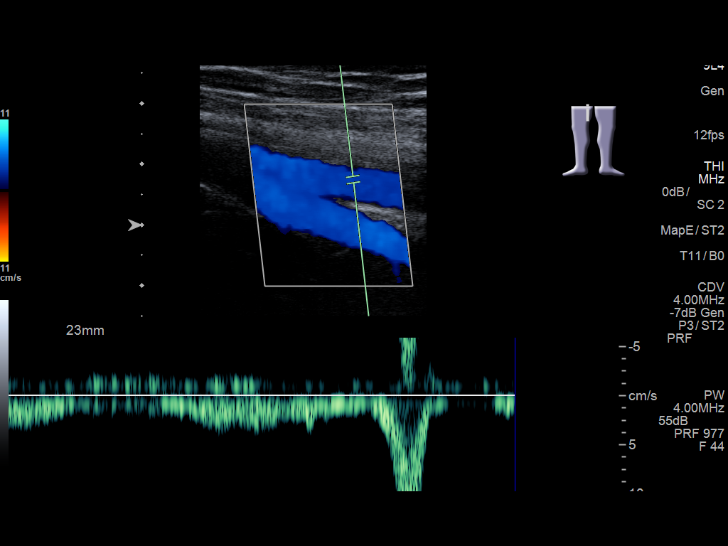
[im 19/34]
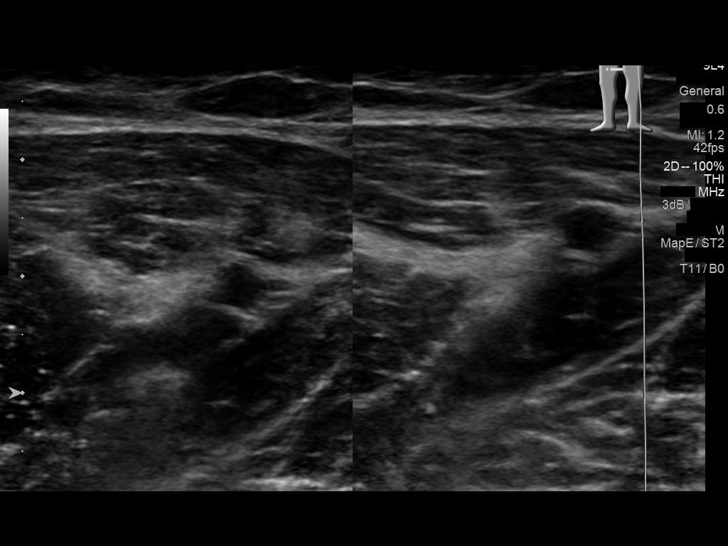
[im 22/34]
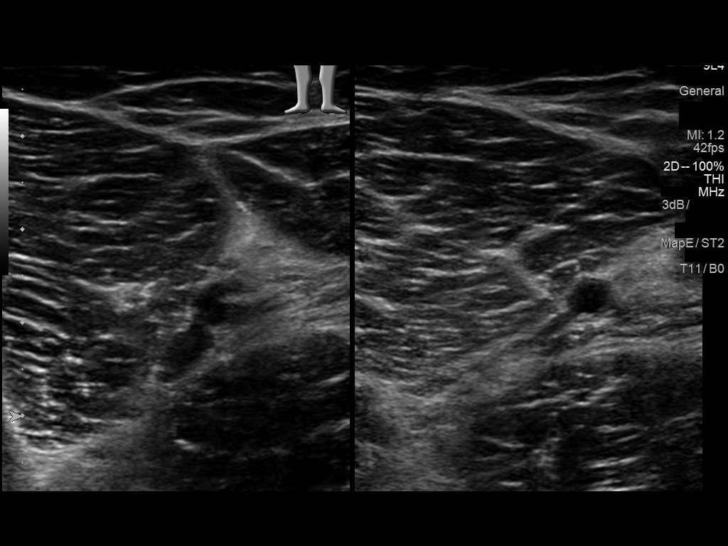
[im 25/34]
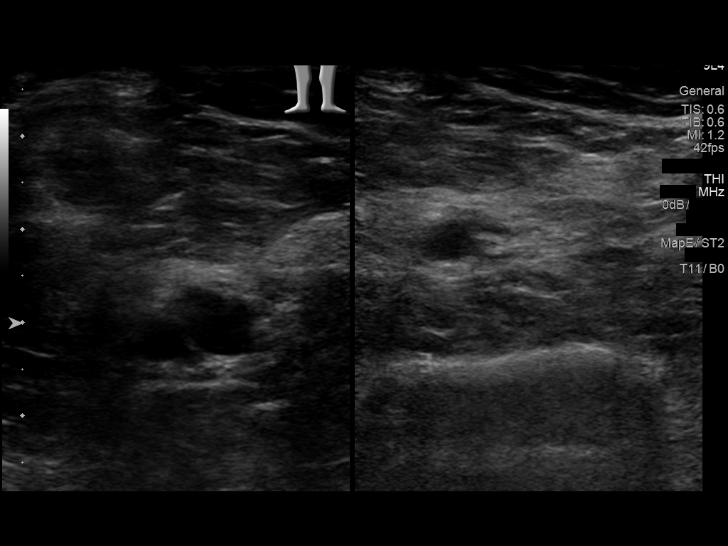
[im 28/34]
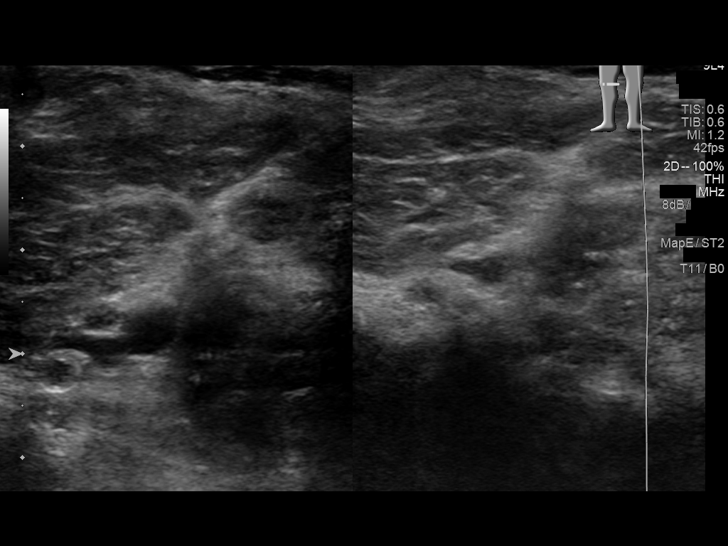
[im 31/34]
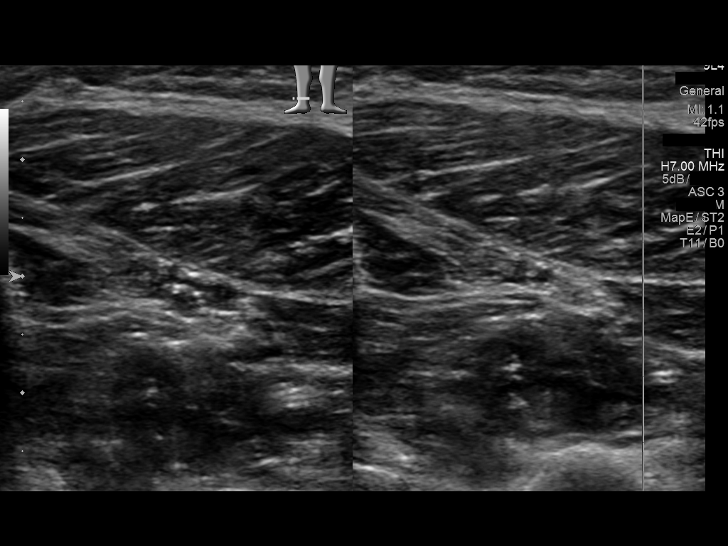
[im 34/34]
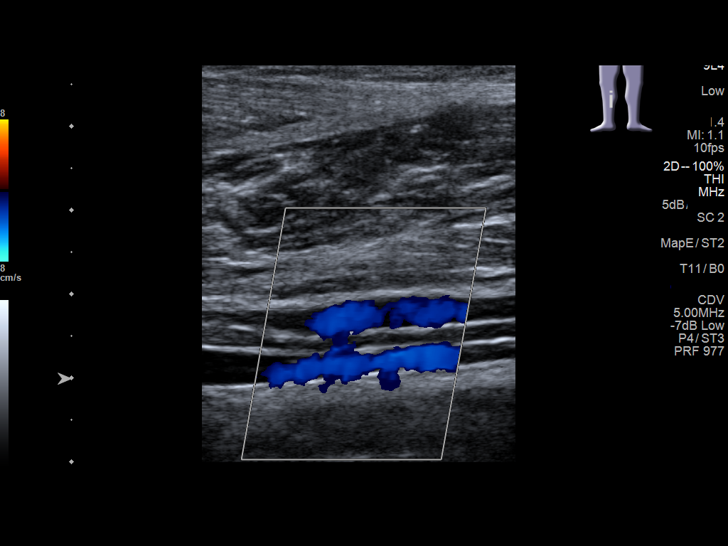

[13 of 24 positions shown; findings below may reference images not displayed]

FINDINGS: Contralateral Common Femoral Vein: Respiratory phasicity is normal
and symmetric with the symptomatic side. No evidence of thrombus.
Normal compressibility.

Common Femoral Vein: No evidence of thrombus. Normal
compressibility, respiratory phasicity and response to augmentation.

Saphenofemoral Junction: No evidence of thrombus. Normal
compressibility and flow on color Doppler imaging.

Profunda Femoral Vein: No evidence of thrombus. Normal
compressibility and flow on color Doppler imaging.

Femoral Vein: No evidence of thrombus. Normal compressibility,
respiratory phasicity and response to augmentation.

Popliteal Vein: No evidence of thrombus. Normal compressibility,
respiratory phasicity and response to augmentation.

Calf Veins: No evidence of thrombus. Normal compressibility and flow
on color Doppler imaging.

Superficial Great Saphenous Vein: No evidence of thrombus. Normal
compressibility and flow on color Doppler imaging.

Venous Reflux:  None.

Other Findings:  None.
IMPRESSION: No evidence of DVT within right lower extremity.

## 2019-05-20 ENCOUNTER — Encounter: Payer: Self-pay | Admitting: Nurse Practitioner

## 2020-12-01 ENCOUNTER — Other Ambulatory Visit: Payer: Self-pay | Admitting: Physician Assistant

## 2020-12-01 ENCOUNTER — Ambulatory Visit
Admission: RE | Admit: 2020-12-01 | Discharge: 2020-12-01 | Disposition: A | Discharge status: Home / Self Care | Payer: Medicaid Other | Source: Ambulatory Visit | Attending: Physician Assistant | Admitting: Physician Assistant

## 2020-12-01 ENCOUNTER — Other Ambulatory Visit: Payer: Self-pay | Admitting: Family Medicine

## 2020-12-01 ENCOUNTER — Other Ambulatory Visit: Payer: Self-pay

## 2020-12-01 ENCOUNTER — Other Ambulatory Visit (HOSPITAL_COMMUNITY): Payer: Self-pay | Admitting: Family Medicine

## 2020-12-01 DIAGNOSIS — R102 Pelvic and perineal pain: Secondary | ICD-10-CM | POA: Diagnosis not present

## 2020-12-04 ENCOUNTER — Other Ambulatory Visit: Payer: Self-pay | Admitting: Physician Assistant

## 2020-12-04 ENCOUNTER — Other Ambulatory Visit (HOSPITAL_COMMUNITY): Payer: Self-pay | Admitting: Physician Assistant

## 2020-12-04 DIAGNOSIS — R102 Pelvic and perineal pain: Secondary | ICD-10-CM

## 2021-01-08 ENCOUNTER — Emergency Department
Admission: EM | Admit: 2021-01-08 | Discharge: 2021-01-08 | Disposition: A | Discharge status: Home / Self Care | Payer: Medicaid Other | Attending: Emergency Medicine | Admitting: Emergency Medicine

## 2021-01-08 ENCOUNTER — Other Ambulatory Visit: Payer: Self-pay

## 2021-01-08 DIAGNOSIS — X111XXA Contact with running hot water, initial encounter: Secondary | ICD-10-CM | POA: Insufficient documentation

## 2021-01-08 DIAGNOSIS — T2123XA Burn of second degree of upper back, initial encounter: Secondary | ICD-10-CM | POA: Insufficient documentation

## 2021-01-08 DIAGNOSIS — J45909 Unspecified asthma, uncomplicated: Secondary | ICD-10-CM | POA: Insufficient documentation

## 2021-01-08 DIAGNOSIS — T2124XA Burn of second degree of lower back, initial encounter: Secondary | ICD-10-CM

## 2021-01-08 MED ORDER — LIDOCAINE HCL URETHRAL/MUCOSAL 2 % EX GEL
1.0000 "application " | Freq: Once | CUTANEOUS | Status: AC
  Administered 2021-01-08: 1 via TOPICAL
  Filled 2021-01-08: qty 5

## 2021-01-08 MED ORDER — LIDOCAINE 3 % EX CREA: 1.0000 "application " | TOPICAL_CREAM | Freq: Two times a day (BID) | CUTANEOUS | 0 refills | Status: DC | PRN

## 2021-01-08 MED ORDER — HYDROCODONE-ACETAMINOPHEN 5-325 MG PO TABS: 1.0000 | ORAL_TABLET | ORAL | 0 refills | Status: DC | PRN

## 2021-01-08 MED ORDER — SILVER SULFADIAZINE 1 % EX CREA
TOPICAL_CREAM | Freq: Once | CUTANEOUS | Status: AC
  Filled 2021-01-08: qty 85

## 2021-01-08 MED ORDER — HYDROCODONE-ACETAMINOPHEN 5-325 MG PO TABS
1.0000 | ORAL_TABLET | Freq: Once | ORAL | Status: AC
Start: 2021-01-08 — End: 2021-01-08
  Administered 2021-01-08: 1 via ORAL
  Filled 2021-01-08: qty 1

## 2021-01-08 MED ORDER — SILVER SULFADIAZINE 1 % EX CREA: TOPICAL_CREAM | CUTANEOUS | 1 refills | Status: AC
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
## 2021-12-06 IMAGING — US US PELVIS COMPLETE WITH TRANSVAGINAL
1 series · 13 of 25 positions shown · non-contrast
Comparison: None available.

CLINICAL DATA: Initial evaluation for acute pelvic pain.



[Series 1: us pelvis complete with transvaginal · 0.18mm/px · 13 of 102 slices shown]
[im 1/102]
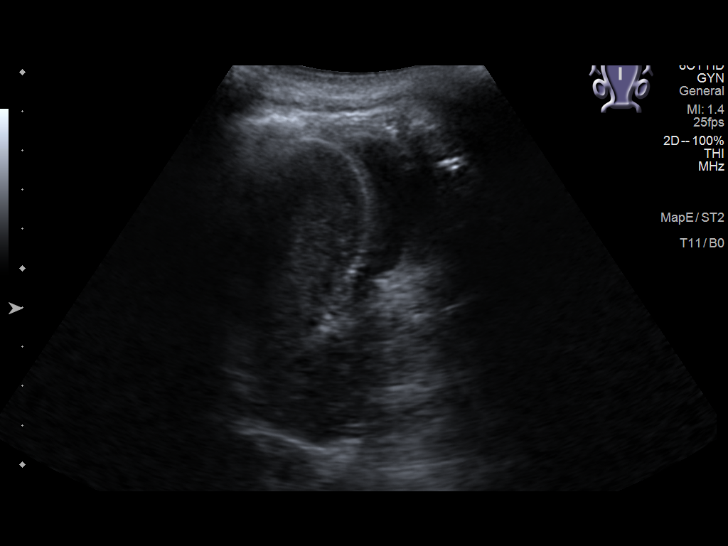
[im 9/102]
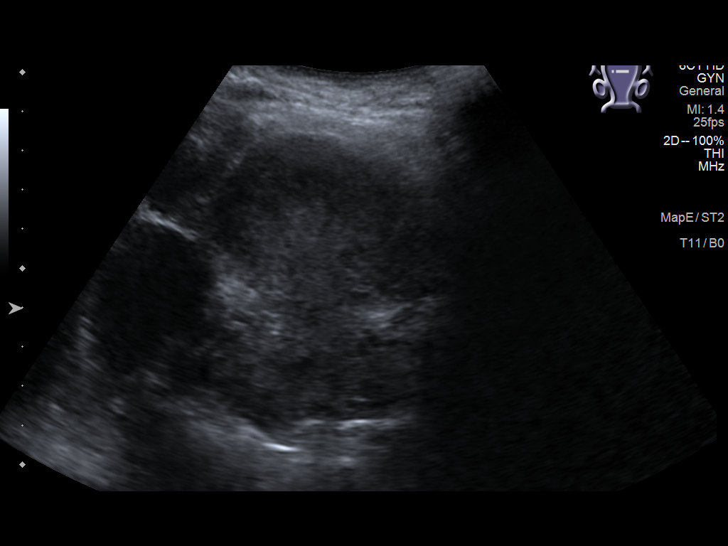
[im 17/102]
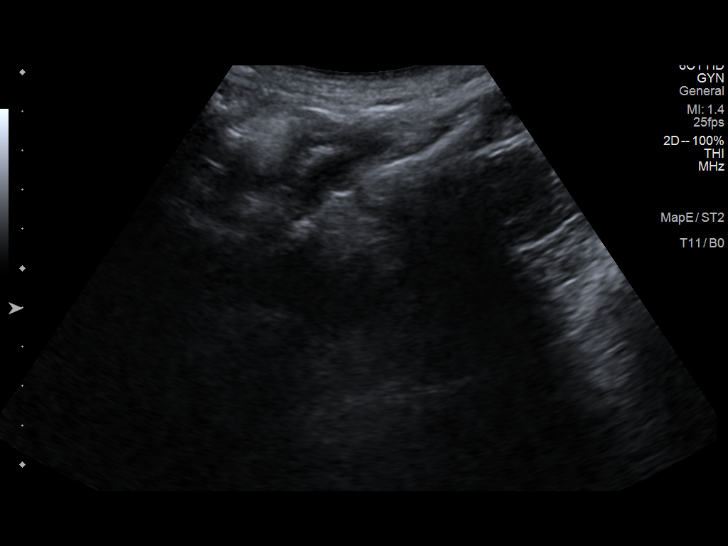
[im 26/102]
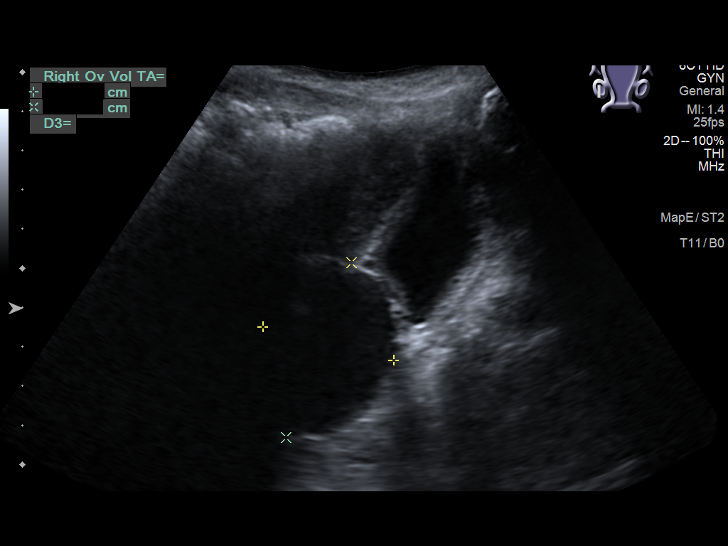
[im 34/102]
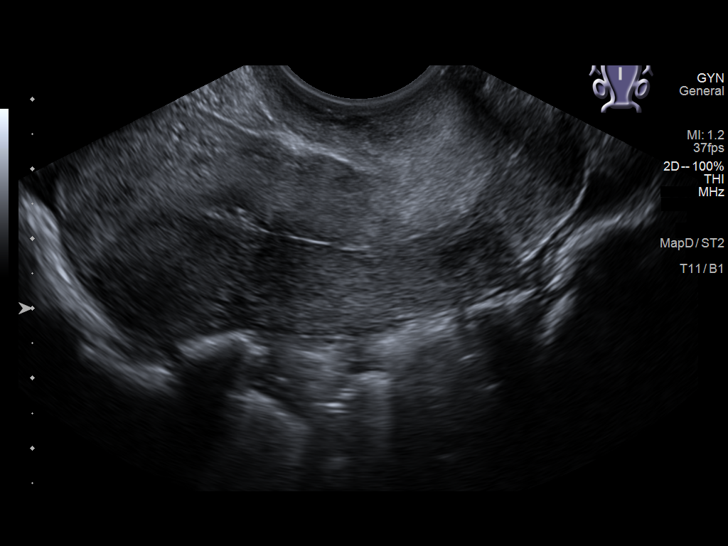
[im 43/102]
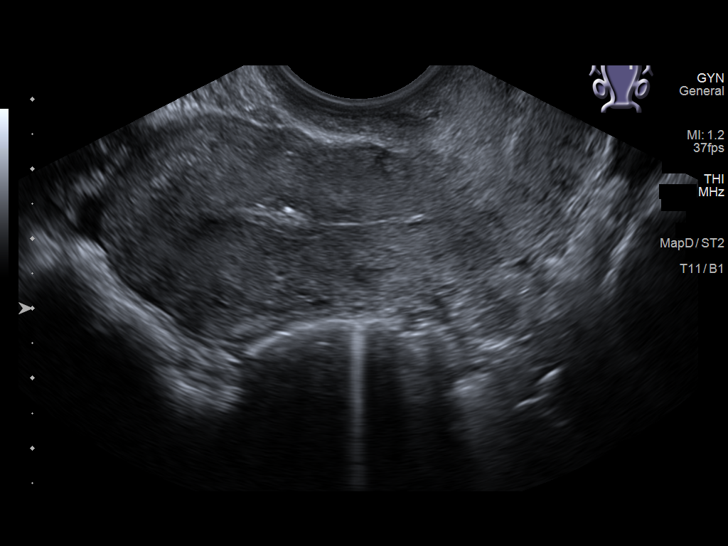
[im 51/102]
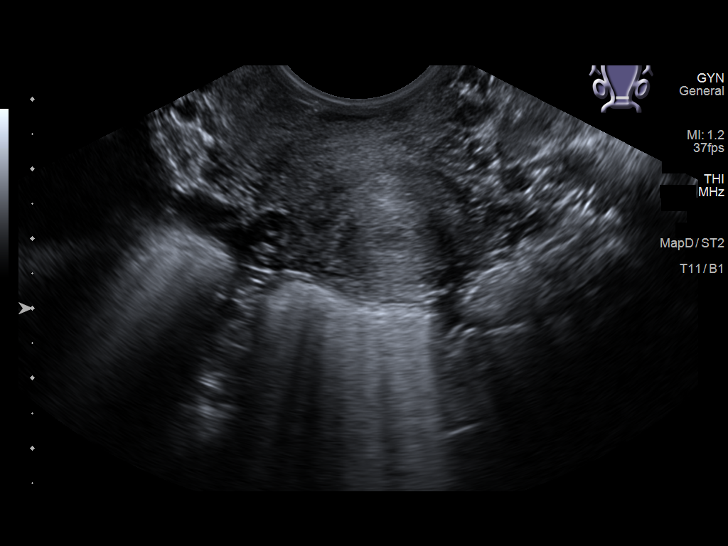
[im 59/102]
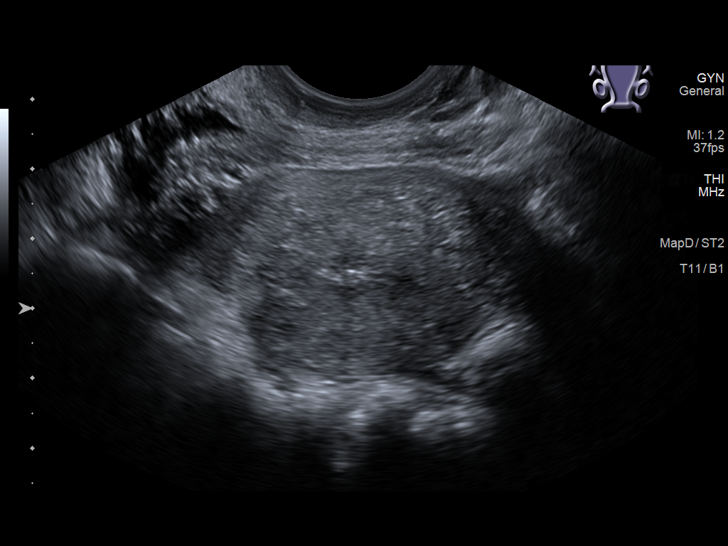
[im 68/102]
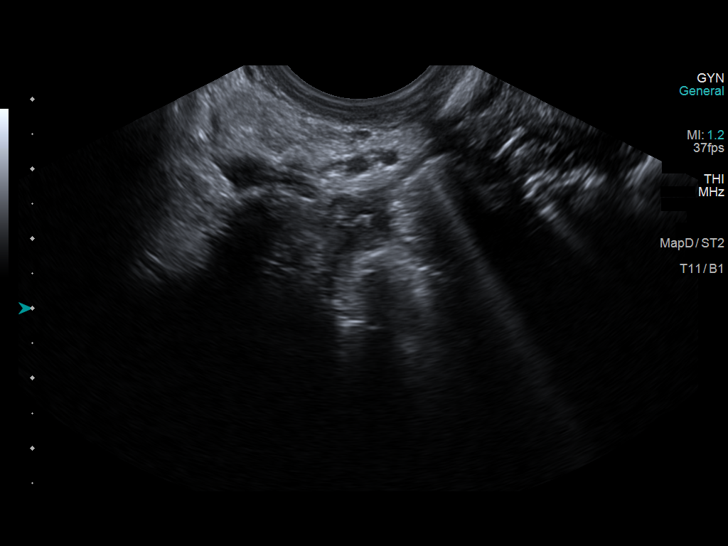
[im 76/102]
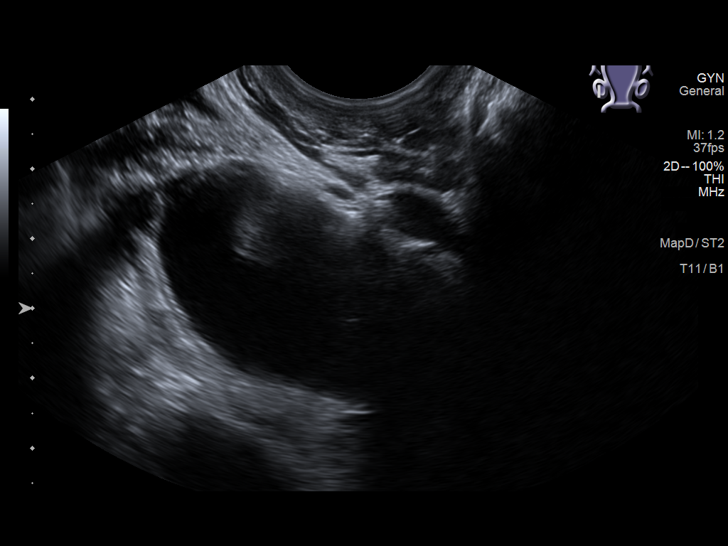
[im 85/102]
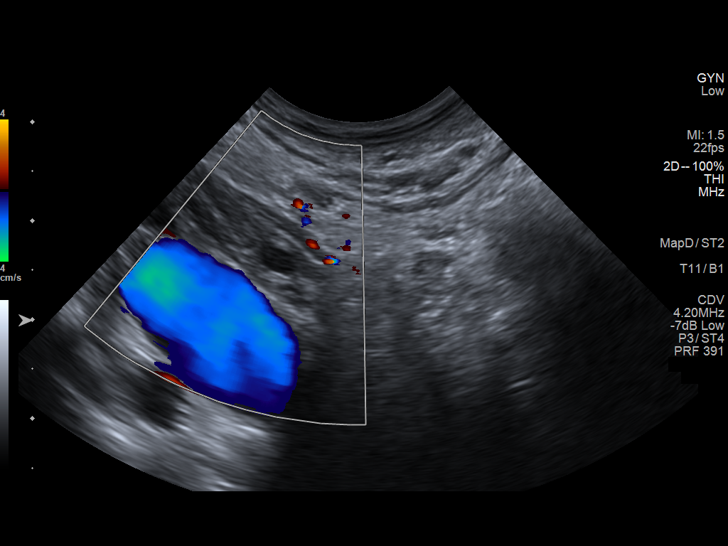
[im 93/102]
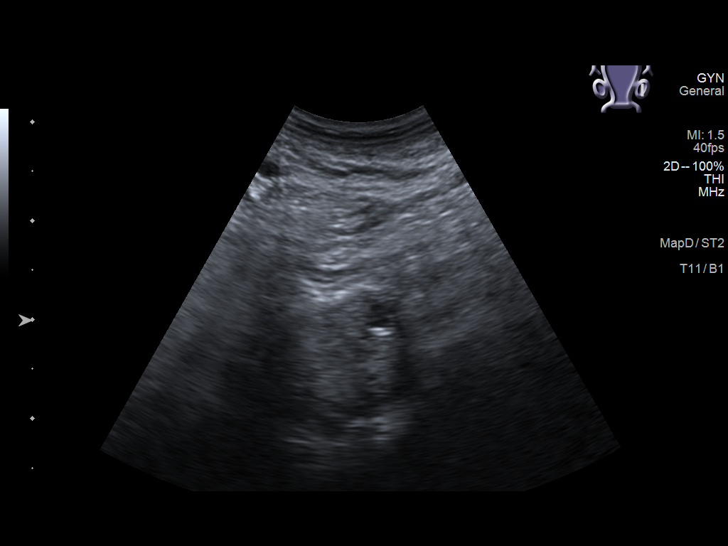
[im 102/102]
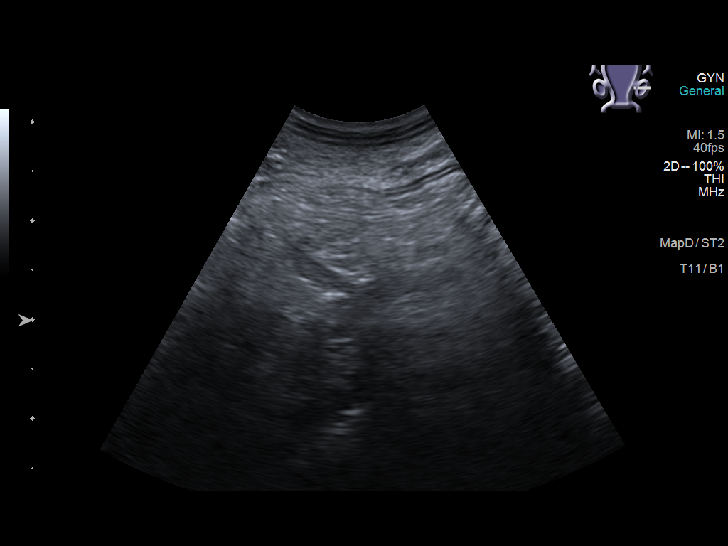

[13 of 25 positions shown; findings below may reference images not displayed]

FINDINGS: Uterus

Measurements: 7.9 x 3.3 x 4.8 cm = volume: 63 mL. No fibroids or
other mass visualized.

Endometrium

Thickness: 4 mm.  No focal abnormality visualized.

Right ovary

Measurements: 3.6 x 2.6 x 2.9 cm = volume: 14 mL. 4.4 x 2.4 x 2.8 cm
simple right ovarian cyst. No internal vascularity, solid
nodularity, or other significant internal complexity.

Left ovary

Measurements: 2.5 x 1.0 x 1.1 cm = volume: 1.5 mL. Normal
appearance/no adnexal mass.

Other findings

No abnormal free fluid.
IMPRESSION: 1. 4.4 cm simple right ovarian cyst, which could contribute to acute
pelvic pain. This has benign characteristics and is a common finding
in premenopausal females. No imaging follow up is required. This
follows consensus guidelines: Simple Adnexal Cysts: SRU Consensus
Conference Update on Follow-up and Reporting. Radiology 7153;
2. Otherwise unremarkable and normal pelvic ultrasound.

## 2023-03-28 ENCOUNTER — Encounter: Payer: Self-pay | Admitting: Emergency Medicine

## 2023-03-28 ENCOUNTER — Emergency Department
Admission: EM | Admit: 2023-03-28 | Discharge: 2023-03-28 | Disposition: A | Discharge status: Home / Self Care | Payer: Medicaid Other | Attending: Emergency Medicine | Admitting: Emergency Medicine

## 2023-03-28 ENCOUNTER — Emergency Department: Payer: Medicaid Other

## 2023-03-28 ENCOUNTER — Other Ambulatory Visit: Payer: Self-pay

## 2023-03-28 DIAGNOSIS — S67194A Crushing injury of right ring finger, initial encounter: Secondary | ICD-10-CM | POA: Insufficient documentation

## 2023-03-28 DIAGNOSIS — W230XXA Caught, crushed, jammed, or pinched between moving objects, initial encounter: Secondary | ICD-10-CM | POA: Insufficient documentation

## 2023-03-28 DIAGNOSIS — S6991XA Unspecified injury of right wrist, hand and finger(s), initial encounter: Secondary | ICD-10-CM | POA: Diagnosis present

## 2023-03-28 DIAGNOSIS — S6710XA Crushing injury of unspecified finger(s), initial encounter: Secondary | ICD-10-CM

## 2023-03-28 NOTE — Discharge Instructions (Signed)
Elevate and ice.  Wear the buddy tape for several days.  Return emergency department worsening
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
## 2023-03-28 NOTE — ED Notes (Signed)
Pt's right ring finger buddy taped to right middle finger.

## 2023-07-07 ENCOUNTER — Ambulatory Visit (LOCAL_COMMUNITY_HEALTH_CENTER): Payer: Self-pay

## 2023-07-07 DIAGNOSIS — Z111 Encounter for screening for respiratory tuberculosis: Secondary | ICD-10-CM

## 2023-07-10 ENCOUNTER — Ambulatory Visit: Payer: Medicaid Other

## 2023-07-10 DIAGNOSIS — Z111 Encounter for screening for respiratory tuberculosis: Secondary | ICD-10-CM

## 2023-07-10 LAB — TB SKIN TEST
Induration: 0 mm
TB Skin Test: NEGATIVE

## 2023-09-18 ENCOUNTER — Other Ambulatory Visit: Payer: Self-pay | Admitting: Primary Care

## 2023-09-18 DIAGNOSIS — Z3201 Encounter for pregnancy test, result positive: Secondary | ICD-10-CM

## 2023-09-22 ENCOUNTER — Ambulatory Visit
Admission: RE | Admit: 2023-09-22 | Discharge: 2023-09-22 | Disposition: A | Discharge status: Home / Self Care | Payer: Medicaid Other | Source: Ambulatory Visit | Attending: Primary Care | Admitting: Primary Care

## 2023-09-22 DIAGNOSIS — Z3201 Encounter for pregnancy test, result positive: Secondary | ICD-10-CM | POA: Diagnosis present

## 2023-09-28 LAB — OB RESULTS CONSOLE RUBELLA ANTIBODY, IGM: Rubella: IMMUNE

## 2023-09-28 LAB — OB RESULTS CONSOLE VARICELLA ZOSTER ANTIBODY, IGG: Varicella: IMMUNE

## 2023-10-25 NOTE — L&D Delivery Note (Signed)
 Delivery Note  First Stage: Labor onset: 1600 Augmentation : none Analgesia /Anesthesia intrapartum: none SROM at 2122  Second Stage: Complete dilation at 2310 Onset of pushing at 2309 FHR second stage reassuring moderate variability, reassured by imminent delivery  Delivery of a viable female infant 05/02/2024 at 2317 by Edsel Blush, CNM. delivery of fetal head in OA position with no restitution.  No nuchal cord; Shoulders delivered easily OA, no need for gentle downward traction. Baby placed on mom's pelvis, and attended to by peds. Very short umbilical cord, about 12 inches. Cord double clamped after cessation of pulsation, cut by FOB Cord blood sample collected   Third Stage: Placenta delivered Keren intact with 3 VC @ 2326 Placenta disposition: discarded Uterine tone firm / bleeding scant  No laceration identified  Anesthesia for repair: none needed Repair none needed Est. Blood Loss (mL):  Complications: none  Mom to postpartum.  Baby to Couplet care / Skin to Skin.  Newborn: Birth Weight: TBD, infant skin-to-skin  Apgar Scores: 9, 10 Feeding planned: breast and formula

## 2023-12-01 ENCOUNTER — Other Ambulatory Visit: Payer: Self-pay | Admitting: Family Medicine

## 2023-12-01 DIAGNOSIS — Z3482 Encounter for supervision of other normal pregnancy, second trimester: Secondary | ICD-10-CM

## 2023-12-15 ENCOUNTER — Ambulatory Visit
Admission: RE | Admit: 2023-12-15 | Discharge: 2023-12-15 | Disposition: A | Discharge status: Home / Self Care | Payer: Medicaid Other | Source: Ambulatory Visit | Attending: Family Medicine | Admitting: Family Medicine

## 2023-12-15 ENCOUNTER — Ambulatory Visit: Payer: Medicaid Other

## 2023-12-15 DIAGNOSIS — Z3A18 18 weeks gestation of pregnancy: Secondary | ICD-10-CM | POA: Insufficient documentation

## 2023-12-15 DIAGNOSIS — Z3689 Encounter for other specified antenatal screening: Secondary | ICD-10-CM | POA: Insufficient documentation

## 2023-12-15 DIAGNOSIS — Z3482 Encounter for supervision of other normal pregnancy, second trimester: Secondary | ICD-10-CM | POA: Insufficient documentation

## 2023-12-29 ENCOUNTER — Other Ambulatory Visit: Payer: Self-pay | Admitting: Family Medicine

## 2023-12-29 DIAGNOSIS — Z3482 Encounter for supervision of other normal pregnancy, second trimester: Secondary | ICD-10-CM

## 2024-01-10 ENCOUNTER — Ambulatory Visit: Admission: RE | Admit: 2024-01-10 | Source: Ambulatory Visit

## 2024-01-15 ENCOUNTER — Ambulatory Visit

## 2024-01-16 ENCOUNTER — Ambulatory Visit
Admission: RE | Admit: 2024-01-16 | Discharge: 2024-01-16 | Disposition: A | Discharge status: Home / Self Care | Source: Ambulatory Visit | Attending: Family Medicine | Admitting: Family Medicine

## 2024-01-16 DIAGNOSIS — Z3482 Encounter for supervision of other normal pregnancy, second trimester: Secondary | ICD-10-CM | POA: Insufficient documentation

## 2024-01-16 DIAGNOSIS — Z3A23 23 weeks gestation of pregnancy: Secondary | ICD-10-CM | POA: Diagnosis not present

## 2024-01-16 DIAGNOSIS — Z3689 Encounter for other specified antenatal screening: Secondary | ICD-10-CM | POA: Diagnosis present

## 2024-02-19 LAB — OB RESULTS CONSOLE HEPATITIS B SURFACE ANTIGEN: Hepatitis B Surface Ag: NEGATIVE

## 2024-02-19 LAB — OB RESULTS CONSOLE HIV ANTIBODY (ROUTINE TESTING): HIV: NONREACTIVE

## 2024-02-19 LAB — OB RESULTS CONSOLE RPR: RPR: NONREACTIVE

## 2024-04-17 LAB — OB RESULTS CONSOLE GBS: GBS: NEGATIVE

## 2024-04-17 LAB — OB RESULTS CONSOLE GC/CHLAMYDIA
Chlamydia: NEGATIVE
Neisseria Gonorrhea: NEGATIVE

## 2024-05-02 ENCOUNTER — Inpatient Hospital Stay
Admission: RE | Admit: 2024-05-02 | Discharge: 2024-05-04 | DRG: 807 | Disposition: A | Discharge status: Home / Self Care

## 2024-05-02 ENCOUNTER — Other Ambulatory Visit: Payer: Self-pay

## 2024-05-02 ENCOUNTER — Encounter: Payer: Self-pay | Admitting: Obstetrics and Gynecology

## 2024-05-02 DIAGNOSIS — F819 Developmental disorder of scholastic skills, unspecified: Secondary | ICD-10-CM | POA: Diagnosis present

## 2024-05-02 DIAGNOSIS — Z3A38 38 weeks gestation of pregnancy: Secondary | ICD-10-CM

## 2024-05-02 DIAGNOSIS — O99344 Other mental disorders complicating childbirth: Secondary | ICD-10-CM | POA: Diagnosis present

## 2024-05-02 DIAGNOSIS — J45909 Unspecified asthma, uncomplicated: Secondary | ICD-10-CM

## 2024-05-02 DIAGNOSIS — F4320 Adjustment disorder, unspecified: Secondary | ICD-10-CM

## 2024-05-02 DIAGNOSIS — O99019 Anemia complicating pregnancy, unspecified trimester: Secondary | ICD-10-CM

## 2024-05-02 DIAGNOSIS — O0940 Supervision of pregnancy with grand multiparity, unspecified trimester: Secondary | ICD-10-CM

## 2024-05-02 DIAGNOSIS — O26893 Other specified pregnancy related conditions, third trimester: Secondary | ICD-10-CM | POA: Diagnosis present

## 2024-05-02 DIAGNOSIS — O479 False labor, unspecified: Secondary | ICD-10-CM | POA: Diagnosis present

## 2024-05-02 LAB — CBC
HCT: 35.6 % — ABNORMAL LOW (ref 36.0–46.0)
Hemoglobin: 11.2 g/dL — ABNORMAL LOW (ref 12.0–15.0)
MCH: 22 pg — ABNORMAL LOW (ref 26.0–34.0)
MCHC: 31.5 g/dL (ref 30.0–36.0)
MCV: 69.9 fL — ABNORMAL LOW (ref 80.0–100.0)
Platelets: 273 K/uL (ref 150–400)
RBC: 5.09 MIL/uL (ref 3.87–5.11)
RDW: 15.8 % — ABNORMAL HIGH (ref 11.5–15.5)
WBC: 10.6 K/uL — ABNORMAL HIGH (ref 4.0–10.5)
nRBC: 0 % (ref 0.0–0.2)

## 2024-05-02 LAB — TYPE AND SCREEN
ABO/RH(D): O POS
Antibody Screen: NEGATIVE

## 2024-05-02 MED ORDER — LIDOCAINE HCL (PF) 1 % IJ SOLN: 30.0000 mL | INTRAMUSCULAR | Status: DC | PRN

## 2024-05-02 MED ORDER — LACTATED RINGERS IV SOLN: INTRAVENOUS | Status: DC

## 2024-05-02 MED ORDER — OXYTOCIN-SODIUM CHLORIDE 30-0.9 UT/500ML-% IV SOLN
2.5000 [IU]/h | INTRAVENOUS | Status: DC
  Filled 2024-05-02: qty 500

## 2024-05-02 MED ORDER — ONDANSETRON HCL 4 MG/2ML IJ SOLN: 4.0000 mg | Freq: Four times a day (QID) | INTRAMUSCULAR | Status: DC | PRN

## 2024-05-02 MED ORDER — LACTATED RINGERS IV SOLN: 500.0000 mL | INTRAVENOUS | Status: DC | PRN

## 2024-05-02 MED ORDER — OXYTOCIN 10 UNIT/ML IJ SOLN
INTRAMUSCULAR | Status: AC
  Filled 2024-05-02: qty 2

## 2024-05-02 MED ORDER — FENTANYL CITRATE (PF) 100 MCG/2ML IJ SOLN: 50.0000 ug | INTRAMUSCULAR | Status: DC | PRN

## 2024-05-02 MED ORDER — OXYTOCIN BOLUS FROM INFUSION
333.0000 mL | Freq: Once | INTRAVENOUS | Status: AC
  Administered 2024-05-02: 333 mL via INTRAVENOUS

## 2024-05-02 MED ORDER — SOD CITRATE-CITRIC ACID 500-334 MG/5ML PO SOLN: 30.0000 mL | ORAL | Status: DC | PRN

## 2024-05-02 MED ORDER — ACETAMINOPHEN 325 MG PO TABS: 650.0000 mg | ORAL_TABLET | ORAL | Status: DC | PRN

## 2024-05-02 MED ORDER — LIDOCAINE HCL (PF) 1 % IJ SOLN
INTRAMUSCULAR | Status: AC
  Filled 2024-05-02: qty 30

## 2024-05-02 MED ORDER — MISOPROSTOL 200 MCG PO TABS
ORAL_TABLET | ORAL | Status: AC
  Filled 2024-05-02: qty 4

## 2024-05-02 MED ORDER — AMMONIA AROMATIC IN INHA
RESPIRATORY_TRACT | Status: AC
  Filled 2024-05-02: qty 10

## 2024-05-02 NOTE — Discharge Summary (Signed)
 Postpartum Discharge Summary  Patient Name: Catherine Schneider DOB: [birth year] MRN: [id number]  Date of admission: 05/02/2024 Delivery date:05/02/2024 Delivering provider: TANDA HOUSTON RENEE Date of discharge: 05/04/2024  Primary OB: [facility] OFE:Ejupzwu'd last menstrual period was 08/06/2023. EDC Estimated Date of Delivery: 05/12/24 Gestational Age at Delivery: [redacted]w[redacted]d   Admitting diagnosis: Uterine contractions [O47.9] Intrauterine pregnancy: [redacted]w[redacted]d     Secondary diagnosis:   Principal Problem:   NSVD (normal spontaneous vaginal delivery) Active Problems:   Uterine contractions   Adjustment disorder   Learning disability   Asthma   Anemia affecting pregnancy  Discharge Diagnosis: Term Pregnancy Delivered                                                Post partum procedures:none Augmentation:: N/A Complications: None Delivery Type: spontaneous vaginal delivery Anesthesia: non-pharmacological methods Placenta: spontaneous To Pathology: No  Laceration: none Episiotomy: none  Prenatal Labs:  Blood type/Rh O positive  Antibody screen neg  Rubella Immune  Varicella Immune  RPR NR  HBsAg Neg  HIV NR  GC neg  Chlamydia neg  Genetic screening    1 hour GTT 124  3 hour GTT    GBS Negative    Hospital course: Onset of Labor With Vaginal Delivery      28 y.o. yo [id number] at [redacted]w[redacted]d was admitted in Active Labor on 05/02/2024. Labor course was without complication. She SROM with clear fluid then rapidly progressed to 10/100/+2 and pushed , delivering viable female infant over intact perineum. Apgars 9/10. Membrane Rupture Time/Date: 9:22 PM,05/02/2024  Delivery Method:Vaginal, Spontaneous Operative Delivery:N/A Episiotomy: None Lacerations:  None Patient had a postpartum course complicated by none.  She is ambulating, tolerating a regular diet, passing flatus, and urinating well. Patient is discharged home in stable condition on 05/04/24.  Newborn Data: Star Birth  date:05/02/2024 Birth time:11:17 PM Gender:Female Living status:Living Apgars:9 ,10  Weight:3040 g  Magnesium Sulfate received: No BMZ received: No Rhophylac:No MMR:No Varivax vaccine given: was not indicated T-DaP:Given prenatally Flu: No  Transfusion:No  Physical exam  Vitals:   05/03/24 1630 05/03/24 2007 05/04/24 0015 05/04/24 0158  BP: 110/73 96/61 111/74   Pulse: 80 (!) 101 71   Resp: 18 20 20    Temp: 97.7 F (36.5 C) 98.9 F (37.2 C) (!) 97.5 F (36.4 C) 98.6 F (37 C)  TempSrc: Oral Oral Oral Oral  SpO2: 100% 98% 99%   Weight:      Height:       General: alert, cooperative, and no distress Lochia: appropriate Uterine Fundus: firm Perineum:minimal edema/intact DVT Evaluation: No evidence of DVT seen on physical exam.  Labs: Lab Results  Component Value Date   WBC 16.0 (H) 05/03/2024   HGB 11.1 (L) 05/03/2024   HCT 34.1 (L) 05/03/2024   MCV 69.5 (L) 05/03/2024   PLT 269 05/03/2024      Latest Ref Rng & Units 11/06/2015    7:48 PM  CMP  Glucose 65 - 99 mg/dL 892   BUN 6 - 20 mg/dL 15   Creatinine 9.55 - 1.00 mg/dL 9.45   Sodium 864 - 854 mmol/L 139   Potassium 3.5 - 5.1 mmol/L 4.1   Chloride 101 - 111 mmol/L 108   CO2 22 - 32 mmol/L 25   Calcium 8.9 - 10.3 mg/dL 9.5   Total Protein 6.5 - 8.1 g/dL  7.2   Total Bilirubin 0.3 - 1.2 mg/dL 0.3   Alkaline Phos 38 - 126 U/L 43   AST 15 - 41 U/L 14   ALT 14 - 54 U/L 12    Edinburgh Score:    05/04/2024    1:58 AM  Edinburgh Postnatal Depression Scale Screening Tool  I have been able to laugh and see the funny side of things. 0  I have looked forward with enjoyment to things. 0  I have blamed myself unnecessarily when things went wrong. 0  I have been anxious or worried for no good reason. 0  I have felt scared or panicky for no good reason. 0  Things have been getting on top of me. 0  I have been so unhappy that I have had difficulty sleeping. 0  I have felt sad or miserable. 0  I have been so  unhappy that I have been crying. 0  The thought of harming myself has occurred to me. 0  Edinburgh Postnatal Depression Scale Total 0    Risk assessment for postpartum VTE and prophylactic treatment: Very high risk factors: None High risk factors: None Moderate risk factors: None  Postpartum VTE prophylaxis with LMWH not indicated  After visit meds:  Allergies as of 05/04/2024   No Known Allergies      Medication List     STOP taking these medications    HYDROcodone -acetaminophen  5-325 MG tablet Commonly known as: NORCO/VICODIN   Lidocaine  3 % Crea       TAKE these medications    acetaminophen  500 MG tablet Commonly known as: TYLENOL  Take 2 tablets (1,000 mg total) by mouth every 6 (six) hours as needed for mild pain (pain score 1-3), fever or headache (for pain scale < 4).   ibuprofen  600 MG tablet Commonly known as: ADVIL  Take 1 tablet (600 mg total) by mouth every 6 (six) hours as needed for mild pain (pain score 1-3) or cramping. What changed:  when to take this reasons to take this   prenatal multivitamin Tabs tablet Take 1 tablet by mouth daily at 12 noon.       Discharge home in stable condition Infant Feeding: Bottle and Breast Infant Disposition:home with mother Discharge instruction: per After Visit Summary and Postpartum booklet. Activity: Advance as tolerated. Pelvic rest for 6 weeks.  Diet: routine diet Anticipated Birth Control: Unsure Postpartum Appointment:6 weeks Additional Postpartum F/U: Postpartum Depression checkup Future Appointments:No future appointments. Follow up Visit:  Follow-up Information     [facility] Follow up in 2 week(s).   Specialty: General Practice Why: 2wk mood check Contact information: [address] [place] [address] [phone]         [facility] Follow up in 6 week(s).   Specialty: General Practice Why: 6wk postpartum Contact  information: [address] [place] [address] [phone]                 Plan:  VIRNA LIVENGOOD was discharged to home in good condition. Follow-up appointment as directed.    Signed:  Therisa CHRISTELLA Pillow, CNM 05/04/2024 9:32 AM  Therisa Pillow, CNM Certified Nurse Midwife [facility]

## 2024-05-02 NOTE — H&P (Signed)
 OB History & Physical   History of Present Illness:  Chief Complaint:   HPI:  Catherine Schneider is a 28 y.o. G2P1001 female at [redacted]w[redacted]d dated by LMP.  She presents to L&D for uterine contractions since yesterday that have become stronger and closer together. She reports good fetal movement and denies vaginal bleeding and/or leaking fluid.   Pregnancy Issues: 1. Learning disability 2. Asthma 3. Adjustment disorder 4. Anemia  Maternal Medical History:   Past Medical History:  Diagnosis Date   Asthma     History reviewed. No pertinent surgical history.  No Known Allergies  Prior to Admission medications   Medication Sig Start Date End Date Taking? Authorizing Provider  Prenatal Vit-Fe Fumarate-FA (PRENATAL MULTIVITAMIN) TABS tablet Take 1 tablet by mouth daily at 12 noon. 11/07/15  Yes Harlee Lynwood LABOR, MD  HYDROcodone -acetaminophen  (NORCO/VICODIN) 5-325 MG tablet Take 1 tablet by mouth every 4 (four) hours as needed for moderate pain. Patient not taking: Reported on 07/07/2023 01/08/21   Cuthriell, Jonathan D, PA-C  ibuprofen  (ADVIL ,MOTRIN ) 600 MG tablet Take 1 tablet (600 mg total) by mouth every 6 (six) hours. Patient not taking: Reported on 07/07/2023 05/20/16   Verdon Keen, MD  Lidocaine  3 % CREA Apply 1 application topically 2 (two) times daily as needed (pain). Patient not taking: Reported on 07/07/2023 01/08/21   Cuthriell, Dorn BIRCH, PA-C    [facility]  Social History: She  reports that she has never smoked. She has never used smokeless tobacco. She reports that she does not drink alcohol and does not use drugs.  Family History: family history is not on file.   Review of Systems: A full review of systems was performed and negative except as noted in the HPI.     Physical Exam:  Vital Signs: BP 121/73 (BP Location: Left Arm)   Pulse 82   Temp 98 F (36.7 C) (Oral)   Resp 17   LMP 08/06/2023  General: no acute distress.  HEENT: normocephalic,  atraumatic Heart: regular rate & rhythm.  No murmurs/rubs/gallops Lungs: clear to auscultation bilaterally, normal respiratory effort Abdomen: soft, gravid, non-tender;  EFW: 7.5lb Pelvic:   External: Normal external female genitalia  Cervix: Dilation: 4 / Effacement (%): 80 / Station: -1    Extremities: non-tender, symmetric, mild edema bilaterally.  DTRs: +2  Neurologic: Alert & oriented x 3.    No results found for this or any previous visit (from the past 24 hours).  Pertinent Results:  Prenatal Labs: Blood type/Rh O positive  Antibody screen neg  Rubella Immune  Varicella Immune  RPR NR  HBsAg Neg  HIV NR  GC neg  Chlamydia neg  Genetic screening   1 hour GTT 124  3 hour GTT   GBS Negative   FHT: 145bpm, moderate variability, accelerations present, no decelerations TOCO: contractions q3-97min, palpates moderate SVE:  Dilation: 4 / Effacement (%): 80 / Station: -1    Cephalic by leopolds  No results found.  Assessment:  Catherine Schneider is a 28 y.o. G34P1001 female at [redacted]w[redacted]d with uterine contractions.   Plan:  1. Admit to Labor & Delivery; consents reviewed and obtained - Dr. Leonce notified of admission and plan of care  2. Fetal Well being  - Fetal Tracing: Category I tracing - Group B Streptococcus ppx indicated: n/a, GBS negative - Presentation: vertex confirmed by SVE   3. Routine OB: - Prenatal labs reviewed, as above - Rh positive - CBC, T&S, RPR on  admit - Clear fluids, saline lock  4. Monitoring of Labor -  Contractions q3-67min, external toco in place -  Pelvis proven to 3033g, adequate for trial of labor -  Plan for augmentation with pitocin  and/or AROM -  Plan for continuous fetal monitoring  -  Maternal pain control as desired; requesting IVPM,  - Anticipate vaginal delivery  5. Post Partum Planning: - Infant feeding: formula - Contraception: considering Nexplanon - Tdap: received AP - Flu: out of season  Edsel Charlies Blush,  [place] 05/02/24 8:41 PM

## 2024-05-03 ENCOUNTER — Encounter: Payer: Self-pay | Admitting: Obstetrics and Gynecology

## 2024-05-03 LAB — CBC
HCT: 34.1 % — ABNORMAL LOW (ref 36.0–46.0)
Hemoglobin: 11.1 g/dL — ABNORMAL LOW (ref 12.0–15.0)
MCH: 22.6 pg — ABNORMAL LOW (ref 26.0–34.0)
MCHC: 32.6 g/dL (ref 30.0–36.0)
MCV: 69.5 fL — ABNORMAL LOW (ref 80.0–100.0)
Platelets: 269 K/uL (ref 150–400)
RBC: 4.91 MIL/uL (ref 3.87–5.11)
RDW: 15.6 % — ABNORMAL HIGH (ref 11.5–15.5)
WBC: 16 K/uL — ABNORMAL HIGH (ref 4.0–10.5)
nRBC: 0 % (ref 0.0–0.2)

## 2024-05-03 LAB — RPR: RPR Ser Ql: NONREACTIVE

## 2024-05-03 MED ORDER — ONDANSETRON HCL 4 MG PO TABS: 4.0000 mg | ORAL_TABLET | ORAL | Status: DC | PRN

## 2024-05-03 MED ORDER — SENNOSIDES-DOCUSATE SODIUM 8.6-50 MG PO TABS
2.0000 | ORAL_TABLET | Freq: Every day | ORAL | Status: DC
  Administered 2024-05-03 – 2024-05-04 (×2): 2 via ORAL
  Filled 2024-05-03 (×2): qty 2

## 2024-05-03 MED ORDER — ONDANSETRON HCL 4 MG/2ML IJ SOLN: 4.0000 mg | INTRAMUSCULAR | Status: DC | PRN

## 2024-05-03 MED ORDER — SIMETHICONE 80 MG PO CHEW: 80.0000 mg | CHEWABLE_TABLET | ORAL | Status: DC | PRN

## 2024-05-03 MED ORDER — BENZOCAINE-MENTHOL 20-0.5 % EX AERO
1.0000 | INHALATION_SPRAY | CUTANEOUS | Status: DC | PRN
  Filled 2024-05-03: qty 56

## 2024-05-03 MED ORDER — ACETAMINOPHEN 325 MG PO TABS
650.0000 mg | ORAL_TABLET | ORAL | Status: DC | PRN
  Administered 2024-05-03: 650 mg via ORAL
  Filled 2024-05-03: qty 2

## 2024-05-03 MED ORDER — PRENATAL MULTIVITAMIN CH
1.0000 | ORAL_TABLET | Freq: Every day | ORAL | Status: DC
  Administered 2024-05-03: 1 via ORAL
  Filled 2024-05-03 (×2): qty 1

## 2024-05-03 MED ORDER — WITCH HAZEL-GLYCERIN EX PADS: 1.0000 | MEDICATED_PAD | CUTANEOUS | Status: DC | PRN

## 2024-05-03 MED ORDER — COCONUT OIL OIL: 1.0000 | TOPICAL_OIL | Status: DC | PRN

## 2024-05-03 MED ORDER — IBUPROFEN 600 MG PO TABS
600.0000 mg | ORAL_TABLET | Freq: Four times a day (QID) | ORAL | Status: DC
  Administered 2024-05-03 – 2024-05-04 (×6): 600 mg via ORAL
  Filled 2024-05-03 (×6): qty 1

## 2024-05-03 MED ORDER — DIPHENHYDRAMINE HCL 25 MG PO CAPS: 25.0000 mg | ORAL_CAPSULE | Freq: Four times a day (QID) | ORAL | Status: DC | PRN

## 2024-05-03 MED ORDER — ZOLPIDEM TARTRATE 5 MG PO TABS: 5.0000 mg | ORAL_TABLET | Freq: Every evening | ORAL | Status: DC | PRN

## 2024-05-03 MED ORDER — DIBUCAINE (PERIANAL) 1 % EX OINT: 1.0000 | TOPICAL_OINTMENT | CUTANEOUS | Status: DC | PRN

## 2024-05-03 NOTE — Progress Notes (Signed)
 Post Partum Day 1 Subjective: Doing well, no complaints.  Tolerating regular diet, pain with PO meds, voiding and ambulating without difficulty.  No CP SOB Fever,Chills, N/V or leg pain; denies nipple or breast pain, no HA change of vision, RUQ/epigastric pain  Objective: BP 106/62 (BP Location: Left Arm)   Pulse 72   Temp 97.8 F (36.6 C) (Oral)   Resp 18   Ht 5' 4 (1.626 m)   Wt 66.2 kg   LMP 08/06/2023   SpO2 100%   Breastfeeding Unknown   BMI 25.06 kg/m    Physical Exam:  General: NAD Breasts: soft/nontender CV: RRR Pulm: nl effort, CTABL Abdomen: soft, NT, BS x 4 Perineum: minimal edema, intact Lochia: moderate Uterine Fundus: fundus firm and 1 fb below umbilicus DVT Evaluation: no cords, ttp LEs   Recent Labs    05/02/24 2058 05/03/24 0444  HGB 11.2* 11.1*  HCT 35.6* 34.1*  WBC 10.6* 16.0*  PLT 273 269    Assessment/Plan: 28 y.o. G2P2002 postpartum day # 1  - Continue routine PP care - Lactation consult PRN - Discussed contraceptive options including implant, IUDs hormonal and non-hormonal, injection, pills/ring/patch, condoms, and NFP.  - Acute blood loss anemia, clinically insignificant - hemoglobin changed from 11.2 to 11.1, patient is asymptomatic, hemodynamically stable; start po ferrous sulfate  BID with stool softeners,   - Immunization status: all Imms up to date  Disposition: Does not desire Dc home today.   Edsel Charlies Blush, CNM 05/03/2024 9:11 AM

## 2024-05-03 NOTE — Lactation Note (Signed)
 This note was copied from a baby's chart. Lactation Consultation Note  Patient Name: Catherine Schneider Date: 05/03/2024 Age:27 hours Reason for consult: Initial assessment   Maternal Data   MOB and FOB awake and holding swaddled sleeping infant upon LC room entry. MOB reports having started breastfeeding upon unit admission, but has been more comfortable feeding formula via slow flow nipple and bottle due to challenges with infant oral anatomy and feeding directly at breast.   Feeding Mother's Current Feeding Choice: Formula Nipple Type: Slow - flow   Lactation Tools Discussed/Used  FOB inquired if combo feeding would still be an option, LC provided assurance about flexibility with infant feeding plans/methods (ie pumping and providing expressed milk, combo feeding, exclusive feeding, tools to facilitate breastfeeding, etc). LC updated information in room and encouraged MOB to contact lactation while inpatient if assessment for next infant feeding is requested, as well as outpatient clinic options in the event any issues or changes should arise upon discharge home.   Interventions Interventions: Breast feeding basics reviewed;Ice;Education;LC Services brochure If suppression of lactation is intended outcome based upon current stated feeding choice, LC advised the following: MOB should consider wearing a supportive bra around the clock and avoid tight or restrictive clothing. If breasts become full or heavy, only express enough milk to ease fullness as to not stimulate your breasts and encourage lactation. Apply a cold compress or ice to your breasts for 5-15 minutes multiple times a day to mitigate swelling, elevate breast tissue to encourage lymphatic drainage, and consider taking an OTC NSAID such as ibuprofen  to bring down any swelling or inflammation. Watch for signs of blocked ducts or infection--like painful lumps, redness, fever, chills, or body aches--and reach out to your  provider or lactation clinic should any changes occur. Discharge Discharge Education: Engorgement and breast care;Warning signs for feeding baby;Outpatient recommendation (Antipatory guidance provided in the event attempt to provide breastmilk recurs.)  Consult Status Consult Status: Complete (MOB will contact lactation if decision to incorporate breastfeeding resumes)    Catherine Schneider 05/03/2024, 5:19 PM

## 2024-05-04 MED ORDER — ACETAMINOPHEN 500 MG PO TABS: 1000.0000 mg | ORAL_TABLET | Freq: Four times a day (QID) | ORAL | Status: AC | PRN

## 2024-05-04 MED ORDER — IBUPROFEN 600 MG PO TABS: 600.0000 mg | ORAL_TABLET | Freq: Four times a day (QID) | ORAL | 0 refills | Status: AC | PRN

## 2024-05-04 NOTE — Progress Notes (Signed)
Patient discharged. Discharge instructions given. Patient verbalizes understanding. Transported by RN. 

## 2024-05-04 NOTE — Discharge Instructions (Signed)
 Vaginal Delivery, Care After Refer to this sheet in the next few weeks. These discharge instructions provide you with information on caring for yourself after delivery. Your caregiver may also give you specific instructions. Your treatment has been planned according to the most current medical practices available, but problems sometimes occur. Call your caregiver if you have any problems or questions after you go home. HOME CARE INSTRUCTIONS Take over-the-counter or prescription medicines only as directed by your caregiver or pharmacist. Do not drink alcohol, especially if you are breastfeeding or taking medicine to relieve pain. Do not smoke tobacco. Continue to use good perineal care. Good perineal care includes: Wiping your perineum from back to front Keeping your perineum clean. You can do sitz baths twice a day, to help keep this area clean Do not use tampons, douche or have sex until your caregiver says it is okay. Shower only and avoid sitting in submerged water, aside from sitz baths Wear a well-fitting bra that provides breast support. Eat healthy foods. Drink enough fluids to keep your urine clear or pale yellow. Eat high-fiber foods such as whole grain cereals and breads, brown rice, beans, and fresh fruits and vegetables every day. These foods may help prevent or relieve constipation. Avoid constipation with high fiber foods or medications, such as miralax or metamucil Follow your caregiver's recommendations regarding resumption of activities such as climbing stairs, driving, lifting, exercising, or traveling. Talk to your caregiver about resuming sexual activities. Resumption of sexual activities is dependent upon your risk of infection, your rate of healing, and your comfort and desire to resume sexual activity. Try to have someone help you with your household activities and your newborn for at least a few days after you leave the hospital. Rest as much as possible. Try to rest or  take a nap when your newborn is sleeping. Increase your activities gradually. Keep all of your scheduled postpartum appointments. It is very important to keep your scheduled follow-up appointments. At these appointments, your caregiver will be checking to make sure that you are healing physically and emotionally. SEEK MEDICAL CARE IF:  You are passing large clots from your vagina. Save any clots to show your caregiver. You have a foul smelling discharge from your vagina. You have trouble urinating. You are urinating frequently. You have pain when you urinate. You have a change in your bowel movements. You have increasing redness, pain, or swelling near your vaginal incision (episiotomy) or vaginal tear. You have pus draining from your episiotomy or vaginal tear. Your episiotomy or vaginal tear is separating. You have painful, hard, or reddened breasts. You have a severe headache. You have blurred vision or see spots. You feel sad or depressed. You have thoughts of hurting yourself or your newborn. You have questions about your care, the care of your newborn, or medicines. You are dizzy or light-headed. You have a rash. You have nausea or vomiting. You were breastfeeding and have not had a menstrual period within 12 weeks after you stopped breastfeeding. You are not breastfeeding and have not had a menstrual period by the 12th week after delivery. You have a fever. SEEK IMMEDIATE MEDICAL CARE IF:  You have persistent pain. You have chest pain. You have shortness of breath. You faint. You have leg pain. You have stomach pain. Your vaginal bleeding saturates two or more sanitary pads in 1 hour. MAKE SURE YOU:  Understand these instructions. Will watch your condition. Will get help right away if you are not doing well or  get worse. Document Released: 10/07/2000 Document Revised: 02/24/2014 Document Reviewed: 06/06/2012 [facility] Patient Information 2015 [place], [place]. This  information is not intended to replace advice given to you by your health care provider. Make sure you discuss any questions you have with your health care provider.  Sitz Bath A sitz bath is a warm water bath taken in the sitting position. The water covers only the hips and butt (buttocks). We recommend using one that fits in the toilet, to help with ease of use and cleanliness. It may be used for either healing or cleaning purposes. Sitz baths are also used to relieve pain, itching, or muscle tightening (spasms). The water may contain medicine. Moist heat will help you heal and relax.  HOME CARE  Take 3 to 4 sitz baths a day. Fill the bathtub half-full with warm water. Sit in the water and open the drain a little. Turn on the warm water to keep the tub half-full. Keep the water running constantly. Soak in the water for 15 to 20 minutes. After the sitz bath, pat the affected area dry. GET HELP RIGHT AWAY IF: You get worse instead of better. Stop the sitz baths if you get worse. MAKE SURE YOU: Understand these instructions. Will watch your condition. Will get help right away if you are not doing well or get worse. Document Released: 11/17/2004 Document Revised: 07/04/2012 Document Reviewed: 02/07/2011 [facility] Patient Information 2015 [place], [place]. This information is not intended to replace advice given to you by your health care provider. Make sure you discuss any questions you have with your health care provider.
